# Patient Record
Sex: Female | Born: 1950 | Race: White | Hispanic: No | Marital: Married
Health system: Midwestern US, Academic
[De-identification: ages and names within clinical notes are randomized; demographics above are authoritative.]

---

## 2017-05-27 ENCOUNTER — Ambulatory Visit: Admit: 2017-05-27 | Discharge: 2017-05-28 | Payer: MEDICARE

## 2017-05-27 ENCOUNTER — Encounter: Admit: 2017-05-27 | Discharge: 2017-05-27 | Payer: MEDICARE

## 2017-05-27 DIAGNOSIS — I82412 Acute embolism and thrombosis of left femoral vein: Principal | ICD-10-CM

## 2017-05-28 DIAGNOSIS — Z95810 Presence of automatic (implantable) cardiac defibrillator: ICD-10-CM

## 2017-05-28 DIAGNOSIS — I5022 Chronic systolic (congestive) heart failure: Principal | ICD-10-CM

## 2017-05-29 ENCOUNTER — Ambulatory Visit: Admit: 2017-05-29 | Discharge: 2017-05-30 | Payer: MEDICARE

## 2017-05-29 ENCOUNTER — Encounter: Admit: 2017-05-29 | Discharge: 2017-05-29 | Payer: MEDICARE

## 2017-05-29 DIAGNOSIS — Z01810 Encounter for preprocedural cardiovascular examination: Principal | ICD-10-CM

## 2017-05-29 DIAGNOSIS — R9439 Abnormal result of other cardiovascular function study: ICD-10-CM

## 2017-05-29 DIAGNOSIS — M069 Rheumatoid arthritis, unspecified: ICD-10-CM

## 2017-05-29 DIAGNOSIS — Z8542 Personal history of malignant neoplasm of other parts of uterus: ICD-10-CM

## 2017-05-29 DIAGNOSIS — I428 Other cardiomyopathies: ICD-10-CM

## 2017-05-29 DIAGNOSIS — K219 Gastro-esophageal reflux disease without esophagitis: ICD-10-CM

## 2017-05-29 DIAGNOSIS — I420 Dilated cardiomyopathy: ICD-10-CM

## 2017-05-29 DIAGNOSIS — I5022 Chronic systolic (congestive) heart failure: Principal | ICD-10-CM

## 2017-05-29 DIAGNOSIS — Z95810 Presence of automatic (implantable) cardiac defibrillator: ICD-10-CM

## 2017-05-29 NOTE — Progress Notes
Date of Service: 05/29/2017    Pamela Hood is a 66 y.o. female.       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    In January 2016 her EF was 20% by echo, August 2016 at 15%, May 15,2015 45%, and recently on May 02, 2017 65%.    She followed up with Dr. Avie Arenas in our office March 06, 2017 and at that time she was not having any shortness of breath.  Previously we had given her Spironolactone to help with her EF but her blood pressure was low and it had to be discontinued.  The most recent interrogation of her ICD on February 27, 2017 revealed 41 episodes of probably atrial tachycardia that were all between 2 and 6 seconds in length, the total A. fib burden was less than 1%.    [name] presents to the office today telling me that since the last office visit she thinks she has been getting along very well.  She does have a history of falls and has had one fall since her last office visit.  She tells me last Saturday she was walking on the sidewalk and she tripped.  She did not get injured.  She does note diaphragmatic stimulation from her ICD that goes into her left lower abdomen.  She notes that that device reps had tried to decrease her settings to avoid this stimulation but we have not been able to remove it completely.  She tells me it is not a pain it is just annoying and it feels like a vibration into her abdomen.  She tells me she is tolerating all of her medications without any problems.    Assessment and plan    1.  Heart failure with previous ejection fraction as low as 15-20% and improved to 65% as of May 02, 2017.  She continues on losartan and metoprolol for guideline directed medical therapy.  She has previously been unable to tolerate Spironolactone due to low blood pressures.  She is a New York Heart Association class II symptoms.  We reviewed the need to call our office if she has increased weight, increased shortness of air, or increased lower leg edema.    2.  ICD implant for nonischemic cardiomyopathy and left bundle branch block???stable.  Her last device check from Apr 29, 2017 revealed less than 1% A. fib burden.  She denies any shocks from her ICD.  She does have some diaphragmatic stimulation that radiates into her abdomen but tells me it is just annoying and she is able to tolerate it.    3.  History of falls.  She denies feeling lightheaded or dizzy.  She reports that last Saturday she did trip on the sidewalk and fall but was not injured.  She denies any prodrome prior to falling.  She tells me she wonders if it is poor balance.    4. History of uterine cancer currently in the mid and remission.    5. History of DVT on coumadin anticoagulation.    She will plan to follow up in our office with Dr. Avie Arenas in 3 months.  Thank you for the opportunity to participate in the care of this patient.  Please feel free to call Pamela Hood if you have any questions or concerns.    Loraine Leriche, APRN-C  DRB         Vitals:    05/29/17 1259 05/29/17 1308   BP: 124/74 120/68   Pulse: 88    Weight: 60.1 kg (132 lb 9.6 oz)    Height: 1.549 m (5' 1)      Body mass index is 25.05 kg/m???.     Past Medical History  Patient Active Problem List    Diagnosis Date Noted   ??? Chronic systolic heart failure (HCC) 03/06/2017   ??? Vasovagal syncope 05/06/2016   ??? ICD (implantable cardioverter-defibrillator), biventricular, in situ 11/07/2015     ??? 11/07/15 CRT-D Implantation (no DFTs) - Dr. Wallene Huh - Device programmed around diaphragmatic stimulation      ??? NICM (nonischemic cardiomyopathy) (HCC) 11/07/2015   ??? Biventricular implantable cardioverter-defibrillator in situ 11/07/2015 ??? CHF (congestive heart failure), NYHA class III (HCC) 10/04/2015   ??? On warfarin therapy 08/24/2015   ??? Deep vein thrombosis (DVT) of femoral vein of left lower extremity (HCC) 07/06/2015   ??? ACE-inhibitor cough 12/13/2014   ??? Preoperative cardiovascular examination 07/29/2014   ??? Abnormal thallium stress test 07/29/2014     09/21/2009 Heart cath at [facility]: Normal Coronary arteries and LV function.  08/08/2009: MPI at [facility]:  Study demonstrates evidence of myocardial infarction involving the left anterior descending territory with significant reversible ischemia in this region.  Inferior wall myocardial infarction is noted.  Mild left ventricular systolic dysfunction with wall motion abnormalities as described above.       ??? GERD (gastroesophageal reflux disease) 07/29/2014   ??? Rheumatoid arthritis (HCC) 07/29/2014   ??? History of uterine cancer 07/29/2014   ??? Occipital neuralgia 07/29/2014   ??? Abnormal EKG 07/29/2014   ??? Dilated cardiomyopathy (HCC) 07/29/2014   ??? Left bundle branch block (LBBB) 07/29/2014         Review of Systems   Constitution: Negative.   HENT: Negative.    Eyes: Negative.    Cardiovascular: Negative.    Respiratory: Negative.    Endocrine: Negative.    Hematologic/Lymphatic: Negative.    Skin: Negative.    Musculoskeletal: Negative.    Gastrointestinal: Negative.    Genitourinary: Negative.    Neurological: Negative.    Psychiatric/Behavioral: Negative.    Allergic/Immunologic: Negative.    she denies having chest pain, palpitations, PND, orthopnea, lightheadedness, dizziness, pre-syncope, syncope, coughing, wheezing, shortness of air, dyspnea, sputum production, hemoptysis, or edema. She uses no tobacco products.      Physical Exam      General Appearance: resting comfortably, no acute distress  Skin: warm, moist, no ulcers or xanthomas, RA joint changes in hands  Eyes: conjunctivae and lids normal, pupils are equal and round  Lips & Oral Mucosa: no pallor or cyanosis Ear, Nose, Throat: No deformities  Neck Veins: neck veins are flat, neck veins are not distended  Thyroid: no nodules, masses, tenderness or enlargement  Chest Inspection: chest is normal in appearance, left subclavicular device scar  Respiratory Effort: breathing is unlabored, no respiratory distress  Auscultation/Percussion: lungs clear to auscultation, no rales, rhonchi, or wheezing  PMI: PMI not enlarged or displaced  Cardiac Rhythm: regular rhythm and normal rate  Cardiac Auscultation: Normal S1 & S2, no S3 or S4, no rub  Murmurs: no cardiac murmurs   Carotid Arteries: normal carotid upstroke bilaterally, no bruits  Pedal Pulses: normal symmetric pedal pulses  Lower Extremity Edema: no  lower extremity edema  Abdominal Exam: soft, non-tender, no masses, bowel sounds normal  Abdominal Aorta: nonpalpable abdominal aorta; no abdominal bruits  Liver & Spleen: no organomegaly  Gait & Station: normal balance and gait  Muscle Strength: normal strength and tone  Neurologic Exam: neurological assessment grossly intact  Orientation: oriented to time, place and person  Affect & Mood: appropriate and sustained affect  Other: moves all extremities            Problems Addressed Today  No diagnosis found.                  Current Medications (including today's revisions)  ??? ascorbic acid (VITAMIN-C) 500 mg tablet Take 1,000 mg by mouth daily.   ??? azaTHIOprine (IMURAN) 50 mg tablet Take 50 mg by mouth daily. 2 1/2 tab daily   ??? CALCIUM CARBONATE/VITAMIN D3 (CALCIUM WITH VITAMIN D PO) Take 1,400 mg by mouth twice daily.   ??? FERROUS SULFATE PO Take 130 mg by mouth.   ??? FLAXSEED OIL (OMEGA 3 PO) Take 900 mg by mouth twice daily.   ??? FLAXSEED OIL PO Take 1,000 mg by mouth daily.   ??? hydroxychloroquine (PLAQUENIL) 200 mg tablet Take 200 mg by mouth daily. takes 1 1/2 daily   ??? losartan (COZAAR) 25 mg tablet TAKE 1 TABLET EVERY DAY   ??? metoprolol XL (TOPROL XL) 25 mg extended release tablet TAKE 1 TABLET EVERY DAY ??? omeprazole DR(+) (PRILOSEC) 40 mg capsule Take 40 mg by mouth daily.   ??? warfarin (COUMADIN) 2 mg tablet Take 3 mg by mouth daily.

## 2017-05-30 DIAGNOSIS — Z95810 Presence of automatic (implantable) cardiac defibrillator: Principal | ICD-10-CM

## 2017-06-13 ENCOUNTER — Encounter: Admit: 2017-06-13 | Discharge: 2017-06-13 | Payer: MEDICARE

## 2017-06-13 NOTE — Telephone Encounter
Spoke with pt and she was given the results as below. Order placed for OV in 6 months

## 2017-06-13 NOTE — Telephone Encounter
-----   Message from Nickolas Madrid, MD sent at 06/12/2017  5:20 PM CDT -----  Please call this patient and informed her that the left ventricular systolic function is normal by the most recent echocardiogram.  I would like to see her back in the office in 6 months from the last office visit.    Thank you!      ----- Message -----  From: Laurence Aly, MD  Sent: 05/15/2017   9:14 AM  To: Nickolas Madrid, MD

## 2017-06-17 ENCOUNTER — Encounter: Admit: 2017-06-17 | Discharge: 2017-06-17 | Payer: MEDICARE

## 2017-06-18 MED ORDER — LOSARTAN 25 MG PO TAB
ORAL_TABLET | Freq: Every day | ORAL | 3 refills | 30.00000 days | Status: AC
Start: 2017-06-18 — End: 2018-05-11

## 2017-06-26 ENCOUNTER — Encounter: Admit: 2017-06-26 | Discharge: 2017-06-26 | Payer: MEDICARE

## 2017-06-26 NOTE — Telephone Encounter
Left msg at preferred # re: overdue INR. Asked pt to contact us if pt has had INR drawn recently. Advised if pt has not had INR since last check (05/27/17) pt should to get to lab within 1 week for INR. Left cardiology main # for call back prn.      ----- Message -----  From: Asencion Noble  Sent: 06/24/2017  To: [facility] Nurse [place]  Subject: INR Due 06/17/17                                  Darrel Hoover A is due for an INR test on 06/17/17.

## 2017-06-27 ENCOUNTER — Encounter: Admit: 2017-06-27 | Discharge: 2017-06-27 | Payer: MEDICARE

## 2017-06-27 DIAGNOSIS — I82412 Acute embolism and thrombosis of left femoral vein: Principal | ICD-10-CM

## 2017-06-27 MED ORDER — WARFARIN 2 MG PO TAB
2-4 mg | ORAL_TABLET | Freq: Every day | ORAL | 3 refills | 90.00000 days | Status: AC
Start: 2017-06-27 — End: 2018-08-07

## 2017-07-25 ENCOUNTER — Encounter: Admit: 2017-07-25 | Discharge: 2017-07-25 | Payer: MEDICARE

## 2017-07-25 NOTE — Progress Notes
Spoke with pt relative to results, dosage and date of next INR. Decreased dose on 07/25/17 by 50% and the weekly dose by 5 %. Pt will check INR on 07/31/17.

## 2017-08-05 ENCOUNTER — Encounter: Admit: 2017-08-05 | Discharge: 2017-08-05 | Payer: MEDICARE

## 2017-08-05 DIAGNOSIS — I82412 Acute embolism and thrombosis of left femoral vein: Principal | ICD-10-CM

## 2017-08-15 ENCOUNTER — Encounter: Admit: 2017-08-15 | Discharge: 2017-08-15 | Payer: MEDICARE

## 2017-08-15 NOTE — Progress Notes
CRITICAL RESULT NOTIFICATION    Critical result or procedure called (document test and value, and read back): completed with [facility] staff at 1251  Time MD/NP Notified: 1254  MD/NP Name: Pamela Raveling A. Fredda Hammed, MD (as Dr. Nickolas Madrid out of the office today)  MD/NP Response/Orders Given: Hold today's dose, then take 3mg  on Saturday and Sunday and recheck INR on Monday.    Attempted to call pt at preferred # on chart, but spouse states she has not returned from the lab and would be back in the afternoon. He states she does not routinely turn on her cell phone and suggested I call back in afternoon at home #.    Spoke to pt at 1346 and she indicated she had already taken today's dose. She will hold tomorrow's dose and take only 2mg  on Sunday with INR rehceck on Monday.Pamela A. Friday, MD agreed with plan. Pt also verbalized understanding and was agreeable to this plan. No further needs identified at this time.

## 2017-08-18 ENCOUNTER — Encounter: Admit: 2017-08-18 | Discharge: 2017-08-18 | Payer: MEDICARE

## 2017-08-18 DIAGNOSIS — I82412 Acute embolism and thrombosis of left femoral vein: Principal | ICD-10-CM

## 2017-08-26 ENCOUNTER — Encounter: Admit: 2017-08-26 | Discharge: 2017-08-26 | Payer: MEDICARE

## 2017-08-26 DIAGNOSIS — I82412 Acute embolism and thrombosis of left femoral vein: Principal | ICD-10-CM

## 2017-08-28 ENCOUNTER — Ambulatory Visit: Admit: 2017-08-28 | Discharge: 2017-08-29 | Payer: MEDICARE

## 2017-08-29 DIAGNOSIS — Z95810 Presence of automatic (implantable) cardiac defibrillator: ICD-10-CM

## 2017-08-29 DIAGNOSIS — I428 Other cardiomyopathies: Principal | ICD-10-CM

## 2017-09-03 ENCOUNTER — Encounter: Admit: 2017-09-03 | Discharge: 2017-09-03 | Payer: MEDICARE

## 2017-09-11 ENCOUNTER — Ambulatory Visit: Admit: 2017-09-11 | Discharge: 2017-09-12 | Payer: MEDICARE

## 2017-09-11 ENCOUNTER — Encounter: Admit: 2017-09-11 | Discharge: 2017-09-11 | Payer: MEDICARE

## 2017-09-11 DIAGNOSIS — I420 Dilated cardiomyopathy: Principal | ICD-10-CM

## 2017-09-11 DIAGNOSIS — K219 Gastro-esophageal reflux disease without esophagitis: ICD-10-CM

## 2017-09-11 DIAGNOSIS — M069 Rheumatoid arthritis, unspecified: ICD-10-CM

## 2017-09-11 DIAGNOSIS — I82412 Acute embolism and thrombosis of left femoral vein: ICD-10-CM

## 2017-09-11 DIAGNOSIS — Z7901 Long term (current) use of anticoagulants: ICD-10-CM

## 2017-09-11 DIAGNOSIS — I428 Other cardiomyopathies: ICD-10-CM

## 2017-09-11 DIAGNOSIS — Z01810 Encounter for preprocedural cardiovascular examination: Principal | ICD-10-CM

## 2017-09-11 DIAGNOSIS — Z95810 Presence of automatic (implantable) cardiac defibrillator: ICD-10-CM

## 2017-09-11 DIAGNOSIS — R05 Cough: ICD-10-CM

## 2017-09-11 DIAGNOSIS — I5022 Chronic systolic (congestive) heart failure: Principal | ICD-10-CM

## 2017-09-11 DIAGNOSIS — R55 Syncope and collapse: ICD-10-CM

## 2017-09-11 DIAGNOSIS — I447 Left bundle-branch block, unspecified: ICD-10-CM

## 2017-09-11 DIAGNOSIS — Z8542 Personal history of malignant neoplasm of other parts of uterus: ICD-10-CM

## 2017-09-18 ENCOUNTER — Encounter: Admit: 2017-09-18 | Discharge: 2017-09-18 | Payer: MEDICARE

## 2017-09-18 DIAGNOSIS — I82412 Acute embolism and thrombosis of left femoral vein: Principal | ICD-10-CM

## 2017-10-03 ENCOUNTER — Encounter: Admit: 2017-10-03 | Discharge: 2017-10-03 | Payer: MEDICARE

## 2017-10-09 ENCOUNTER — Encounter: Admit: 2017-10-09 | Discharge: 2017-10-09 | Payer: MEDICARE

## 2017-10-09 DIAGNOSIS — I82412 Acute embolism and thrombosis of left femoral vein: Principal | ICD-10-CM

## 2017-10-16 ENCOUNTER — Encounter: Admit: 2017-10-16 | Discharge: 2017-10-16 | Payer: MEDICARE

## 2017-10-16 DIAGNOSIS — I82412 Acute embolism and thrombosis of left femoral vein: Principal | ICD-10-CM

## 2017-10-17 ENCOUNTER — Encounter: Admit: 2017-10-17 | Discharge: 2017-10-17 | Payer: MEDICARE

## 2017-10-17 DIAGNOSIS — I82412 Acute embolism and thrombosis of left femoral vein: Principal | ICD-10-CM

## 2017-10-30 ENCOUNTER — Encounter: Admit: 2017-10-30 | Discharge: 2017-10-30 | Payer: MEDICARE

## 2017-10-30 DIAGNOSIS — I82412 Acute embolism and thrombosis of left femoral vein: Principal | ICD-10-CM

## 2017-10-31 ENCOUNTER — Encounter: Admit: 2017-10-31 | Discharge: 2017-10-31 | Payer: MEDICARE

## 2017-10-31 DIAGNOSIS — I82412 Acute embolism and thrombosis of left femoral vein: Principal | ICD-10-CM

## 2017-11-07 ENCOUNTER — Encounter: Admit: 2017-11-07 | Discharge: 2017-11-07 | Payer: MEDICARE

## 2017-11-07 DIAGNOSIS — I82412 Acute embolism and thrombosis of left femoral vein: Principal | ICD-10-CM

## 2017-11-17 ENCOUNTER — Encounter: Admit: 2017-11-17 | Discharge: 2017-11-17 | Payer: MEDICARE

## 2017-11-17 DIAGNOSIS — I82412 Acute embolism and thrombosis of left femoral vein: Principal | ICD-10-CM

## 2017-11-17 LAB — COMPREHENSIVE METABOLIC PANEL
Lab: 0.3
Lab: 1 — ABNORMAL LOW (ref 33.0–37.0)
Lab: 101
Lab: 103 10*3/uL — ABNORMAL LOW (ref 37.0–47.0)
Lab: 140 mL/min — ABNORMAL LOW (ref 4.20–5.40)
Lab: 17
Lab: 17 — ABNORMAL HIGH (ref 0–14)
Lab: 18
Lab: 24 10*3/uL (ref 0–0.20)
Lab: 3 — ABNORMAL LOW (ref 3.4–4.8)
Lab: 4 mL/min — ABNORMAL LOW (ref 12.0–16.0)
Lab: 66
Lab: 7.8 — ABNORMAL HIGH (ref 11.5–14.5)
Lab: 9
Lab: 9.4

## 2017-11-17 LAB — CBC: Lab: 4.1 10*3/uL — ABNORMAL LOW (ref 4.8–10.8)

## 2017-11-27 ENCOUNTER — Ambulatory Visit: Admit: 2017-11-27 | Discharge: 2017-11-28 | Payer: MEDICARE

## 2017-11-28 ENCOUNTER — Encounter: Admit: 2017-11-28 | Discharge: 2017-11-28 | Payer: MEDICARE

## 2017-11-28 DIAGNOSIS — I428 Other cardiomyopathies: Principal | ICD-10-CM

## 2017-11-28 DIAGNOSIS — Z95810 Presence of automatic (implantable) cardiac defibrillator: ICD-10-CM

## 2017-12-08 ENCOUNTER — Encounter: Admit: 2017-12-08 | Discharge: 2017-12-08 | Payer: MEDICARE

## 2018-01-06 ENCOUNTER — Encounter: Admit: 2018-01-06 | Discharge: 2018-01-06 | Payer: MEDICARE

## 2018-01-06 DIAGNOSIS — I82412 Acute embolism and thrombosis of left femoral vein: Principal | ICD-10-CM

## 2018-01-29 ENCOUNTER — Encounter: Admit: 2018-01-29 | Discharge: 2018-01-29 | Payer: MEDICARE

## 2018-01-30 MED ORDER — METOPROLOL SUCCINATE 25 MG PO TB24
ORAL_TABLET | Freq: Every day | ORAL | 3 refills | 90.00000 days | Status: AC
Start: 2018-01-30 — End: 2019-02-23

## 2018-02-04 ENCOUNTER — Encounter: Admit: 2018-02-04 | Discharge: 2018-02-04 | Payer: MEDICARE

## 2018-02-04 DIAGNOSIS — I82412 Acute embolism and thrombosis of left femoral vein: Principal | ICD-10-CM

## 2018-02-04 LAB — PROTIME INR (PT): Lab: 2.4

## 2018-02-06 ENCOUNTER — Encounter: Admit: 2018-02-06 | Discharge: 2018-02-06 | Payer: MEDICARE

## 2018-02-06 DIAGNOSIS — I82412 Acute embolism and thrombosis of left femoral vein: Principal | ICD-10-CM

## 2018-02-06 LAB — PROTIME INR (PT): Lab: 2.9 MMOL/L (ref 21–30)

## 2018-02-26 ENCOUNTER — Ambulatory Visit: Admit: 2018-02-26 | Discharge: 2018-02-27 | Payer: MEDICARE

## 2018-02-27 DIAGNOSIS — Z95810 Presence of automatic (implantable) cardiac defibrillator: ICD-10-CM

## 2018-02-27 DIAGNOSIS — I428 Other cardiomyopathies: Principal | ICD-10-CM

## 2018-03-16 ENCOUNTER — Encounter: Admit: 2018-03-16 | Discharge: 2018-03-16 | Payer: MEDICARE

## 2018-03-17 ENCOUNTER — Encounter: Admit: 2018-03-17 | Discharge: 2018-03-17 | Payer: MEDICARE

## 2018-03-26 ENCOUNTER — Encounter: Admit: 2018-03-26 | Discharge: 2018-03-26 | Payer: MEDICARE

## 2018-03-26 DIAGNOSIS — M069 Rheumatoid arthritis, unspecified: ICD-10-CM

## 2018-03-26 DIAGNOSIS — K219 Gastro-esophageal reflux disease without esophagitis: ICD-10-CM

## 2018-03-26 DIAGNOSIS — Z01810 Encounter for preprocedural cardiovascular examination: Principal | ICD-10-CM

## 2018-03-26 DIAGNOSIS — Z8542 Personal history of malignant neoplasm of other parts of uterus: ICD-10-CM

## 2018-04-06 ENCOUNTER — Encounter: Admit: 2018-04-06 | Discharge: 2018-04-06 | Payer: MEDICARE

## 2018-04-06 LAB — COMPREHENSIVE METABOLIC PANEL
Lab: 0.3
Lab: 2.8 — ABNORMAL LOW (ref 3.4–4.8)
Lab: 7.2 — ABNORMAL HIGH (ref 11.5–14.5)
Lab: 9.2

## 2018-04-06 LAB — PROTIME INR (PT): Lab: 2.1

## 2018-04-22 ENCOUNTER — Encounter: Admit: 2018-04-22 | Discharge: 2018-04-22 | Payer: MEDICARE

## 2018-04-22 ENCOUNTER — Ambulatory Visit: Admit: 2018-04-22 | Discharge: 2018-04-23 | Payer: MEDICARE

## 2018-04-22 DIAGNOSIS — I428 Other cardiomyopathies: ICD-10-CM

## 2018-04-22 DIAGNOSIS — Z95810 Presence of automatic (implantable) cardiac defibrillator: ICD-10-CM

## 2018-04-22 DIAGNOSIS — I420 Dilated cardiomyopathy: Principal | ICD-10-CM

## 2018-05-04 ENCOUNTER — Encounter: Admit: 2018-05-04 | Discharge: 2018-05-04 | Payer: MEDICARE

## 2018-05-04 DIAGNOSIS — I82412 Acute embolism and thrombosis of left femoral vein: Principal | ICD-10-CM

## 2018-05-04 LAB — PROTIME INR (PT): Lab: 2.6

## 2018-05-11 ENCOUNTER — Encounter: Admit: 2018-05-11 | Discharge: 2018-05-11 | Payer: MEDICARE

## 2018-05-11 MED ORDER — LOSARTAN 25 MG PO TAB
ORAL_TABLET | Freq: Every day | ORAL | 3 refills | 30.00000 days | Status: AC
Start: 2018-05-11 — End: 2019-09-09

## 2018-05-28 ENCOUNTER — Ambulatory Visit: Admit: 2018-05-28 | Discharge: 2018-05-29 | Payer: MEDICARE

## 2018-05-29 DIAGNOSIS — Z95810 Presence of automatic (implantable) cardiac defibrillator: Principal | ICD-10-CM

## 2018-06-01 ENCOUNTER — Encounter: Admit: 2018-06-01 | Discharge: 2018-06-01 | Payer: MEDICARE

## 2018-06-01 DIAGNOSIS — I82412 Acute embolism and thrombosis of left femoral vein: Principal | ICD-10-CM

## 2018-06-01 LAB — PROTIME INR (PT): Lab: 3.2

## 2018-06-04 ENCOUNTER — Encounter: Admit: 2018-06-04 | Discharge: 2018-06-04 | Payer: MEDICARE

## 2018-06-04 ENCOUNTER — Ambulatory Visit: Admit: 2018-06-04 | Discharge: 2018-06-05 | Payer: MEDICARE

## 2018-06-04 DIAGNOSIS — M069 Rheumatoid arthritis, unspecified: ICD-10-CM

## 2018-06-04 DIAGNOSIS — I428 Other cardiomyopathies: Principal | ICD-10-CM

## 2018-06-04 DIAGNOSIS — I82412 Acute embolism and thrombosis of left femoral vein: ICD-10-CM

## 2018-06-04 DIAGNOSIS — Z95810 Presence of automatic (implantable) cardiac defibrillator: Secondary | ICD-10-CM

## 2018-06-04 DIAGNOSIS — Z8542 Personal history of malignant neoplasm of other parts of uterus: ICD-10-CM

## 2018-06-04 DIAGNOSIS — R9431 Abnormal electrocardiogram [ECG] [EKG]: ICD-10-CM

## 2018-06-04 DIAGNOSIS — K219 Gastro-esophageal reflux disease without esophagitis: ICD-10-CM

## 2018-06-04 DIAGNOSIS — Z7901 Long term (current) use of anticoagulants: ICD-10-CM

## 2018-06-04 DIAGNOSIS — I5022 Chronic systolic (congestive) heart failure: Secondary | ICD-10-CM

## 2018-06-04 DIAGNOSIS — Z01810 Encounter for preprocedural cardiovascular examination: Principal | ICD-10-CM

## 2018-06-04 DIAGNOSIS — I447 Left bundle-branch block, unspecified: ICD-10-CM

## 2018-06-04 DIAGNOSIS — R05 Cough: ICD-10-CM

## 2018-06-04 DIAGNOSIS — I420 Dilated cardiomyopathy: ICD-10-CM

## 2018-06-08 ENCOUNTER — Encounter: Admit: 2018-06-08 | Discharge: 2018-06-08 | Payer: MEDICARE

## 2018-06-08 DIAGNOSIS — I82412 Acute embolism and thrombosis of left femoral vein: Principal | ICD-10-CM

## 2018-06-08 LAB — PROTIME INR (PT): Lab: 3

## 2018-06-19 ENCOUNTER — Encounter: Admit: 2018-06-19 | Discharge: 2018-06-19 | Payer: MEDICARE

## 2018-06-19 DIAGNOSIS — I82412 Acute embolism and thrombosis of left femoral vein: Principal | ICD-10-CM

## 2018-06-19 LAB — PROTIME INR (PT): Lab: 2.7 K/UL — ABNORMAL LOW (ref 1.0–4.8)

## 2018-07-24 ENCOUNTER — Encounter: Admit: 2018-07-24 | Discharge: 2018-07-24 | Payer: MEDICARE

## 2018-07-24 ENCOUNTER — Ambulatory Visit: Admit: 2018-07-24 | Discharge: 2018-07-25 | Payer: MEDICARE

## 2018-07-24 DIAGNOSIS — I428 Other cardiomyopathies: Principal | ICD-10-CM

## 2018-07-24 DIAGNOSIS — Z95810 Presence of automatic (implantable) cardiac defibrillator: ICD-10-CM

## 2018-08-05 ENCOUNTER — Encounter: Admit: 2018-08-05 | Discharge: 2018-08-05 | Payer: MEDICARE

## 2018-08-07 ENCOUNTER — Encounter: Admit: 2018-08-07 | Discharge: 2018-08-07 | Payer: MEDICARE

## 2018-08-07 ENCOUNTER — Ambulatory Visit: Admit: 2018-08-07 | Discharge: 2018-08-08 | Payer: MEDICARE

## 2018-08-07 DIAGNOSIS — I5022 Chronic systolic (congestive) heart failure: Secondary | ICD-10-CM

## 2018-08-07 DIAGNOSIS — E7800 Pure hypercholesterolemia, unspecified: Principal | ICD-10-CM

## 2018-08-07 DIAGNOSIS — I447 Left bundle-branch block, unspecified: ICD-10-CM

## 2018-08-07 DIAGNOSIS — K219 Gastro-esophageal reflux disease without esophagitis: ICD-10-CM

## 2018-08-07 DIAGNOSIS — Z95810 Presence of automatic (implantable) cardiac defibrillator: ICD-10-CM

## 2018-08-07 DIAGNOSIS — M069 Rheumatoid arthritis, unspecified: ICD-10-CM

## 2018-08-07 DIAGNOSIS — I420 Dilated cardiomyopathy: ICD-10-CM

## 2018-08-07 DIAGNOSIS — Z01810 Encounter for preprocedural cardiovascular examination: Principal | ICD-10-CM

## 2018-08-07 DIAGNOSIS — I428 Other cardiomyopathies: ICD-10-CM

## 2018-08-07 DIAGNOSIS — R55 Syncope and collapse: ICD-10-CM

## 2018-08-07 DIAGNOSIS — Z8542 Personal history of malignant neoplasm of other parts of uterus: ICD-10-CM

## 2018-08-07 DIAGNOSIS — R9431 Abnormal electrocardiogram [ECG] [EKG]: ICD-10-CM

## 2018-08-07 DIAGNOSIS — I82412 Acute embolism and thrombosis of left femoral vein: ICD-10-CM

## 2018-08-07 DIAGNOSIS — R05 Cough: ICD-10-CM

## 2018-08-14 ENCOUNTER — Encounter: Admit: 2018-08-14 | Discharge: 2018-08-14 | Payer: MEDICARE

## 2018-08-14 DIAGNOSIS — I428 Other cardiomyopathies: Principal | ICD-10-CM

## 2018-08-27 ENCOUNTER — Ambulatory Visit: Admit: 2018-08-27 | Discharge: 2018-08-28 | Payer: MEDICARE

## 2018-08-28 DIAGNOSIS — I428 Other cardiomyopathies: Principal | ICD-10-CM

## 2018-08-28 DIAGNOSIS — Z95810 Presence of automatic (implantable) cardiac defibrillator: ICD-10-CM

## 2018-10-09 ENCOUNTER — Encounter: Admit: 2018-10-09 | Discharge: 2018-10-09 | Payer: MEDICARE

## 2018-11-26 DIAGNOSIS — I420 Dilated cardiomyopathy: Secondary | ICD-10-CM

## 2018-11-27 ENCOUNTER — Ambulatory Visit: Admit: 2018-11-26 | Discharge: 2018-11-27 | Payer: MEDICARE

## 2018-11-27 DIAGNOSIS — I5022 Chronic systolic (congestive) heart failure: Principal | ICD-10-CM

## 2018-11-27 DIAGNOSIS — Z95810 Presence of automatic (implantable) cardiac defibrillator: ICD-10-CM

## 2018-12-25 LAB — CBC
Lab: 10 — ABNORMAL LOW (ref 12.0–16.0)
Lab: 3.5 — ABNORMAL LOW (ref 4.20–5.40)
Lab: 34 — ABNORMAL LOW (ref 37.0–47.0)
Lab: 4.4 — ABNORMAL LOW (ref 4.8–10.8)
Lab: 95

## 2018-12-25 LAB — COMPREHENSIVE METABOLIC PANEL
Lab: 102 — ABNORMAL LOW (ref 33.0–37.0)
Lab: 4.7

## 2019-01-19 ENCOUNTER — Encounter: Admit: 2019-01-19 | Discharge: 2019-01-19 | Payer: MEDICARE

## 2019-02-18 ENCOUNTER — Encounter: Admit: 2019-02-18 | Discharge: 2019-02-18 | Payer: MEDICARE

## 2019-02-18 LAB — HEMOGLOBIN A1C: Lab: 5.4 — ABNORMAL LOW

## 2019-02-18 LAB — BASIC METABOLIC PANEL
Lab: 103 — ABNORMAL LOW
Lab: 139 — ABNORMAL LOW
Lab: 18
Lab: 29
Lab: 7
Lab: >60
Lab: 0.8
Lab: 93
Lab: >60

## 2019-02-18 LAB — CBC: Lab: 4.1

## 2019-02-23 ENCOUNTER — Ambulatory Visit: Admit: 2019-02-23 | Discharge: 2019-02-23 | Payer: MEDICARE

## 2019-02-23 ENCOUNTER — Encounter: Admit: 2019-02-23 | Discharge: 2019-02-23 | Payer: MEDICARE

## 2019-02-23 DIAGNOSIS — I5022 Chronic systolic (congestive) heart failure: ICD-10-CM

## 2019-02-23 DIAGNOSIS — I420 Dilated cardiomyopathy: ICD-10-CM

## 2019-02-23 DIAGNOSIS — I82412 Acute embolism and thrombosis of left femoral vein: ICD-10-CM

## 2019-02-23 DIAGNOSIS — Z01810 Encounter for preprocedural cardiovascular examination: Principal | ICD-10-CM

## 2019-02-23 DIAGNOSIS — M069 Rheumatoid arthritis, unspecified: ICD-10-CM

## 2019-02-23 DIAGNOSIS — K219 Gastro-esophageal reflux disease without esophagitis: ICD-10-CM

## 2019-02-23 DIAGNOSIS — R9431 Abnormal electrocardiogram [ECG] [EKG]: ICD-10-CM

## 2019-02-23 DIAGNOSIS — I447 Left bundle-branch block, unspecified: Principal | ICD-10-CM

## 2019-02-23 DIAGNOSIS — I428 Other cardiomyopathies: ICD-10-CM

## 2019-02-23 DIAGNOSIS — Z8542 Personal history of malignant neoplasm of other parts of uterus: ICD-10-CM

## 2019-02-23 DIAGNOSIS — R05 Cough: ICD-10-CM

## 2019-02-23 DIAGNOSIS — Z95810 Presence of automatic (implantable) cardiac defibrillator: ICD-10-CM

## 2019-02-23 MED ORDER — METOPROLOL SUCCINATE 25 MG PO TB24
25 mg | ORAL_TABLET | Freq: Every day | ORAL | 3 refills | 90.00000 days | Status: AC
Start: 2019-02-23 — End: 2019-12-15

## 2019-02-25 ENCOUNTER — Ambulatory Visit: Admit: 2019-02-25 | Discharge: 2019-02-25 | Payer: MEDICARE

## 2019-02-25 DIAGNOSIS — Z95810 Presence of automatic (implantable) cardiac defibrillator: ICD-10-CM

## 2019-02-25 DIAGNOSIS — I5022 Chronic systolic (congestive) heart failure: Principal | ICD-10-CM

## 2019-02-26 ENCOUNTER — Encounter: Admit: 2019-02-26 | Discharge: 2019-02-26 | Payer: MEDICARE

## 2019-02-26 ENCOUNTER — Ambulatory Visit: Admit: 2019-02-26 | Discharge: 2019-02-27 | Payer: MEDICARE

## 2019-02-26 DIAGNOSIS — I5022 Chronic systolic (congestive) heart failure: ICD-10-CM

## 2019-02-26 DIAGNOSIS — I447 Left bundle-branch block, unspecified: Principal | ICD-10-CM

## 2019-02-26 DIAGNOSIS — I420 Dilated cardiomyopathy: ICD-10-CM

## 2019-03-01 ENCOUNTER — Encounter: Admit: 2019-03-01 | Discharge: 2019-03-01 | Payer: MEDICARE

## 2019-03-12 ENCOUNTER — Encounter: Admit: 2019-03-12 | Discharge: 2019-03-12 | Payer: MEDICARE

## 2019-03-12 NOTE — Telephone Encounter
I called and let patient know that I cancelled her ppm check at the [place] office on.3.24.20. She is on the list to be called later to reschedule.

## 2019-03-12 NOTE — Telephone Encounter
-----   Message from Casey Burkitt, RN sent at 03/12/2019 10:48 AM CDT -----  Regarding: Pls call patient and cancel her in office check scheduled in [place] 3/24  This is a routine check and it can be postponed.  She is monitored remotely.  I have put her on a list to reschedule at a later date.

## 2019-05-27 ENCOUNTER — Encounter: Admit: 2019-05-27 | Discharge: 2019-05-27

## 2019-05-27 DIAGNOSIS — Z95810 Presence of automatic (implantable) cardiac defibrillator: Secondary | ICD-10-CM

## 2019-05-28 ENCOUNTER — Ambulatory Visit: Admit: 2019-05-27 | Discharge: 2019-05-28

## 2019-05-28 DIAGNOSIS — I428 Other cardiomyopathies: Secondary | ICD-10-CM

## 2019-05-28 DIAGNOSIS — I5022 Chronic systolic (congestive) heart failure: Secondary | ICD-10-CM

## 2019-08-17 ENCOUNTER — Ambulatory Visit: Admit: 2019-08-17 | Discharge: 2019-08-18

## 2019-08-17 ENCOUNTER — Encounter: Admit: 2019-08-17 | Discharge: 2019-08-17

## 2019-08-17 DIAGNOSIS — Z95810 Presence of automatic (implantable) cardiac defibrillator: Secondary | ICD-10-CM

## 2019-08-17 DIAGNOSIS — I428 Other cardiomyopathies: Secondary | ICD-10-CM

## 2019-08-18 ENCOUNTER — Encounter: Admit: 2019-08-18 | Discharge: 2019-08-18

## 2019-08-18 DIAGNOSIS — I420 Dilated cardiomyopathy: Secondary | ICD-10-CM

## 2019-08-18 DIAGNOSIS — I447 Left bundle-branch block, unspecified: Secondary | ICD-10-CM

## 2019-08-18 DIAGNOSIS — I5022 Chronic systolic (congestive) heart failure: Secondary | ICD-10-CM

## 2019-08-18 NOTE — Telephone Encounter
-----   Message from Olena Mater, [place] sent at 08/17/2019  4:32 PM CDT -----  Regarding: in-office check today shows ongoing fl. overload/HF  corVue showing possible fl overload/HF ongoing since 08/09/19.    Please f/u as needed.    Thanks,  Kaylee/device team

## 2019-08-18 NOTE — Telephone Encounter
Called and left message requesting callback to discuss symptoms.

## 2019-08-18 NOTE — Telephone Encounter
Patient returned call.  No s/s of heart failure.

## 2019-08-19 ENCOUNTER — Encounter: Admit: 2019-08-19 | Discharge: 2019-08-19

## 2019-08-19 NOTE — Telephone Encounter
08/19/2019 10:44 AM    I received a message from Pamela Hood. She wanted [name] to know that she frequently sits down to rest and falls asleep in the chair.  She wanted to know if this had anything to do with the ICD report showing she has extra fluid on board.  I called her back and she told me she just falls asleep a lot during the day.  I offered her an appointment with Dr Virgina Organ at the [place] office next week 08/24/19 at 11:30

## 2019-08-24 ENCOUNTER — Encounter: Admit: 2019-08-24 | Discharge: 2019-08-24

## 2019-08-24 ENCOUNTER — Ambulatory Visit: Admit: 2019-08-24 | Discharge: 2019-08-25

## 2019-08-24 DIAGNOSIS — I447 Left bundle-branch block, unspecified: Secondary | ICD-10-CM

## 2019-08-24 DIAGNOSIS — Z01810 Encounter for preprocedural cardiovascular examination: Secondary | ICD-10-CM

## 2019-08-24 DIAGNOSIS — I420 Dilated cardiomyopathy: Secondary | ICD-10-CM

## 2019-08-24 DIAGNOSIS — Z8542 Personal history of malignant neoplasm of other parts of uterus: Secondary | ICD-10-CM

## 2019-08-24 DIAGNOSIS — Z95810 Presence of automatic (implantable) cardiac defibrillator: Secondary | ICD-10-CM

## 2019-08-24 DIAGNOSIS — K219 Gastro-esophageal reflux disease without esophagitis: Secondary | ICD-10-CM

## 2019-08-24 DIAGNOSIS — M069 Rheumatoid arthritis, unspecified: Secondary | ICD-10-CM

## 2019-08-24 DIAGNOSIS — I428 Other cardiomyopathies: Secondary | ICD-10-CM

## 2019-08-24 DIAGNOSIS — I82412 Acute embolism and thrombosis of left femoral vein: Secondary | ICD-10-CM

## 2019-08-24 DIAGNOSIS — R05 Cough: Secondary | ICD-10-CM

## 2019-08-24 DIAGNOSIS — I5022 Chronic systolic (congestive) heart failure: Secondary | ICD-10-CM

## 2019-08-24 NOTE — Progress Notes
Date of Service: 08/24/2019    Pamela Hood is a 68 y.o. female.       HPI     Is a very pleasant 68 year old white female, she does have a history of dilated nonischemic cardiomyopathy, in the past her ejection fraction 20%, this was upon diagnosis in August 2015.  She is also known to have a left bundle branch block at baseline.    Patient did undergo goal-directed heart failure therapy and also she underwent resynchronization therapy, the left ventricular systolic function has normalized.  The most recent echocardiogram performed in March 2020 demonstrated an ejection fraction of 55%.    She is not known to have coronary artery disease, patient did undergo evaluation with a thallium stress test, the last one being performed on 08/01/2014, the tomographic pattern was negative for ischemia.    This patient does have rheumatoid arthritis with profound deformities of her digits.  She is currently on 3 immune therapies that include azathioprine, hydroxychloroquine and romosozumab.    The last device interrogation performed on August 17, 2019 did demonstrate fluid overload, however this finding is not matched by physical exam findings.         Vitals:    08/24/19 1117 08/24/19 1131   BP: 126/64 128/60   BP Source: Arm, Left Upper Arm, Right Upper   Pulse: 80    Temp: 36.8 ???C (98.2 ???F)    SpO2: 98%    Weight: 51.3 kg (113 lb)    Height: 1.549 m (5' 1)    PainSc: Zero      Body mass index is 21.35 kg/m???.     Past Medical History  Patient Active Problem List    Diagnosis Date Noted   ??? Preoperative cardiovascular examination 02/23/2019   ??? Chronic systolic heart failure (HCC) 03/06/2017   ??? Vasovagal syncope 05/06/2016   ??? ICD (implantable cardioverter-defibrillator), biventricular, in situ 11/07/2015     ??? 11/07/15 CRT-D Implantation (no DFTs) - Dr. Wallene Huh - Device programmed around diaphragmatic stimulation      ??? NICM (nonischemic cardiomyopathy) (HCC) 11/07/2015 ??? Cardiac resynchronization therapy defibrillator (CRT-D) in place 11/07/2015   ??? CHF (congestive heart failure), NYHA class III (HCC) 10/04/2015   ??? On warfarin therapy 08/24/2015   ??? Deep vein thrombosis (DVT) of femoral vein of left lower extremity (HCC) 07/06/2015   ??? ACE-inhibitor cough 12/13/2014   ??? Preoperative cardiovascular examination 07/29/2014   ??? Abnormal thallium stress test 07/29/2014     09/21/2009 Heart cath at [facility]: Normal Coronary arteries and LV function.  08/08/2009: MPI at [facility]:  Study demonstrates evidence of myocardial infarction involving the left anterior descending territory with significant reversible ischemia in this region.  Inferior wall myocardial infarction is noted.  Mild left ventricular systolic dysfunction with wall motion abnormalities as described above.       ??? GERD (gastroesophageal reflux disease) 07/29/2014   ??? Rheumatoid arthritis (HCC) 07/29/2014   ??? History of uterine cancer 07/29/2014   ??? Occipital neuralgia 07/29/2014   ??? Abnormal EKG 07/29/2014   ??? Dilated cardiomyopathy (HCC) 07/29/2014   ??? Left bundle branch block (LBBB) 07/29/2014         Review of Systems   Constitution: Positive for malaise/fatigue and weight loss.   HENT: Negative.    Eyes: Negative.    Cardiovascular: Negative.    Respiratory: Negative.    Endocrine: Negative.    Hematologic/Lymphatic: Negative.    Skin: Negative.    Musculoskeletal: Positive for arthritis, back  pain, joint pain, myalgias and neck pain.   Gastrointestinal: Negative.    Genitourinary: Negative.    Neurological: Negative.    Psychiatric/Behavioral: Negative.    Allergic/Immunologic: Positive for environmental allergies.       Physical Exam  General Appearance: normal in appearance  Skin: warm, moist, no ulcers or xanthomas  Eyes: conjunctivae and lids normal, pupils are equal and round  Lips & Oral Mucosa: no pallor or cyanosis  Neck Veins: neck veins are flat, neck veins are not distended Chest Inspection: chest is normal in appearance  Respiratory Effort: breathing comfortably, no respiratory distress  Auscultation/Percussion: lungs clear to auscultation, no rales or rhonchi, no wheezing  Cardiac Rhythm: regular rhythm and normal rate  Cardiac Auscultation: S1, S2 normal, no rub, no gallop  Murmurs: no murmur  Carotid Arteries: normal carotid upstroke bilaterally, no bruit  Lower Extremity Edema: no lower extremity edema, peripheral digit deformities due to rheumatoid arthritis  Abdominal Exam: soft, non-tender, no masses, bowel sounds normal  Liver & Spleen: no organomegaly  Language and Memory: patient responsive and seems to comprehend information  Neurologic Exam: neurological assessment grossly intact      Cardiovascular Studies  Twelve-lead EKG demonstrates a sensed V paced rhythm    Problems Addressed Today  Encounter Diagnoses   Name Primary?   ??? Dilated cardiomyopathy (HCC) Yes   ??? Biventricular ICD (implantable cardioverter-defibrillator) in place    ??? Left bundle branch block (LBBB)    ??? Deep vein thrombosis (DVT) of femoral vein of left lower extremity, unspecified chronicity (HCC)    ??? Chronic systolic congestive heart failure, NYHA class 3 (HCC)    ??? NICM (nonischemic cardiomyopathy) (HCC)    ??? Chronic systolic heart failure (HCC)    ??? Rheumatoid arthritis involving multiple sites, unspecified rheumatoid factor presence (HCC)    ??? ACE-inhibitor cough        Assessment and Plan     In summary: This is a 68 year old white female, she is known to have a dilated nonischemic cardiomyopathy with severely preserved ventricular systolic function of 20% in August 2015 upon diagnosis, patient also has a baseline left bundle branch block, with goal-directed heart failure therapy and resynchronization therapy patient's systolic function has recovered, the most recent echocardiogram performed in March 2020 revealed an EF of 55%.    Patient does not have any known CAD. She does have rheumatoid arthritis and she is currently on 3 anti-immune therapy.    Plan:       1.  Continue all current medications  2.  I did ask the patient to follow a low-salt diet  3.  She will be reevaluated with a 2D echo Doppler study in approximately 6 to 7 months, that will be followed by an office visit with me.         Current Medications (including today's revisions)  ??? ascorbic acid (VITAMIN-C) 500 mg tablet Take 1,000 mg by mouth daily.   ??? azaTHIOprine (IMURAN) 50 mg tablet Take 50 mg by mouth daily. 2 1/2 tab daily   ??? CALCIUM CARBONATE/VITAMIN D3 (CALCIUM WITH VITAMIN D PO) Take 1,400 mg by mouth twice daily.   ??? esomeprazole DR (NEXIUM) 40 mg capsule Take 1 capsule by mouth daily.   ??? ferrous sulfate (FEOSOL, FEROSUL) 325 mg (65 mg iron) tablet Take 325 mg by mouth twice daily. Take on an empty stomach at least 1 hour before or 2 hours after food.   ??? FLAXSEED OIL PO Take 1,000 mg by  mouth daily.   ??? hydroxychloroquine (PLAQUENIL) 200 mg tablet Take 200 mg by mouth daily. takes 1 1/2 daily   ??? losartan (COZAAR) 25 mg tablet TAKE 1 TABLET EVERY DAY   ??? metoprolol XL (TOPROL XL) 25 mg extended release tablet Take one tablet by mouth daily.   ??? romosozumab-aqqg (EVENITY) 105 mg/1.17 mL syringe Inject 105 mg under the skin every 28 days.

## 2019-08-26 ENCOUNTER — Ambulatory Visit: Admit: 2019-08-27 | Discharge: 2019-08-27

## 2019-08-26 DIAGNOSIS — Z95810 Presence of automatic (implantable) cardiac defibrillator: Secondary | ICD-10-CM

## 2019-08-27 ENCOUNTER — Encounter: Admit: 2019-08-27 | Discharge: 2019-08-27

## 2019-08-27 DIAGNOSIS — Z95810 Presence of automatic (implantable) cardiac defibrillator: Secondary | ICD-10-CM

## 2019-08-27 DIAGNOSIS — I447 Left bundle-branch block, unspecified: Secondary | ICD-10-CM

## 2019-08-27 DIAGNOSIS — I5022 Chronic systolic (congestive) heart failure: Secondary | ICD-10-CM

## 2019-08-27 DIAGNOSIS — I420 Dilated cardiomyopathy: Secondary | ICD-10-CM

## 2019-09-09 ENCOUNTER — Encounter: Admit: 2019-09-09 | Discharge: 2019-09-09 | Payer: MEDICARE

## 2019-09-09 MED ORDER — LOSARTAN 25 MG PO TAB
25 mg | ORAL_TABLET | Freq: Every day | ORAL | 3 refills | 30.00000 days | Status: DC
Start: 2019-09-09 — End: 2019-09-13

## 2019-09-09 NOTE — Telephone Encounter
Received a fax from the patients local pharmacy requesting a refill. Rx sent as requested by the local pharmacy

## 2019-09-13 ENCOUNTER — Encounter: Admit: 2019-09-13 | Discharge: 2019-09-13 | Payer: MEDICARE

## 2019-09-13 MED ORDER — LOSARTAN 25 MG PO TAB
25 mg | ORAL_TABLET | Freq: Every day | ORAL | 3 refills | 30.00000 days | Status: DC
Start: 2019-09-13 — End: 2019-09-17

## 2019-09-17 ENCOUNTER — Encounter: Admit: 2019-09-17 | Discharge: 2019-09-17 | Payer: MEDICARE

## 2019-09-17 MED ORDER — LOSARTAN 25 MG PO TAB
25 mg | ORAL_TABLET | Freq: Every day | ORAL | 3 refills | 30.00000 days | Status: DC
Start: 2019-09-17 — End: 2019-12-15

## 2019-10-22 ENCOUNTER — Encounter: Admit: 2019-10-22 | Discharge: 2019-10-22 | Payer: MEDICARE

## 2019-10-22 NOTE — Telephone Encounter
Contacted patient by phone on 10/22/2019 regarding appointment scheduled with Dr. Samantha Crimes on 11/11/2019. Patient was originally scheduled for November appointment in August. One on 08/15 and the other on 08/27 for increased fatigue. Patient was able to get in to see Dr. Virgina Organ in [place] on 08/24/2019 for the increased fatigue. In [place] last note on 09/01, she wanted an Echocardiogram in 6-7 months and a follow up with her after the Echo. Patient denied complaints and any current cardiac symptoms at this time. Patient's appointment scheduled for 11/11/2019 is cancelled per coordination of patient care. Patient scheduled for 6 month follow up with Dr. Samantha Crimes in [facility] on 03/02/2020. Patient notified that she will need Echocardiogram before scheduled follow up appointment. Patient requested for Echocardiogram to be performed at [facility]. Patient notified that we will send the order to [facility] and they will contact patient to schedule test. Patient voiced understanding. Patient encouraged to contact our office with any symptoms or complaints before scheduled appointment on 03/02/2020. In basket message sent to [place] Nurse to place order for routine 2D+Doppler Echocardiogram per [facility] to be completed before scheduled appointment and fax to [facility] in [place], [place].

## 2019-11-30 ENCOUNTER — Encounter: Admit: 2019-11-30 | Discharge: 2019-11-30 | Payer: MEDICARE

## 2019-11-30 NOTE — Telephone Encounter
-----   Message from Lillie Fragmin, RN sent at 11/30/2019 12:53 PM CST -----  Regarding: NSVT on remote  Duration 15 sec. Fell below detection rate at end of event. Detection may need to be adjusted.

## 2019-11-30 NOTE — Telephone Encounter
-----   Message from Susan Morrissey, RN sent at 11/30/2019 12:53 PM CST -----  Regarding: NSVT on remote  Duration 15 sec. Fell below detection rate at end of event. Detection may need to be adjusted.

## 2019-11-30 NOTE — Telephone Encounter
Pt denies issues.  States palpations at least weekly.  Routed to [facility] for review and recommendations.

## 2019-12-07 ENCOUNTER — Encounter: Admit: 2019-12-07 | Discharge: 2019-12-07 | Payer: MEDICARE

## 2019-12-07 DIAGNOSIS — I472 Ventricular tachycardia: Secondary | ICD-10-CM

## 2019-12-15 ENCOUNTER — Ambulatory Visit: Admit: 2019-12-15 | Discharge: 2019-12-15 | Payer: MEDICARE

## 2019-12-15 ENCOUNTER — Encounter: Admit: 2019-12-15 | Discharge: 2019-12-15 | Payer: MEDICARE

## 2019-12-15 DIAGNOSIS — I471 Supraventricular tachycardia: Secondary | ICD-10-CM

## 2019-12-15 DIAGNOSIS — M069 Rheumatoid arthritis, unspecified: Secondary | ICD-10-CM

## 2019-12-15 DIAGNOSIS — I472 Ventricular tachycardia: Secondary | ICD-10-CM

## 2019-12-15 DIAGNOSIS — Z95810 Presence of automatic (implantable) cardiac defibrillator: Secondary | ICD-10-CM

## 2019-12-15 DIAGNOSIS — Z8542 Personal history of malignant neoplasm of other parts of uterus: Secondary | ICD-10-CM

## 2019-12-15 DIAGNOSIS — K219 Gastro-esophageal reflux disease without esophagitis: Secondary | ICD-10-CM

## 2019-12-15 DIAGNOSIS — Z01810 Encounter for preprocedural cardiovascular examination: Secondary | ICD-10-CM

## 2019-12-15 MED ORDER — METOPROLOL SUCCINATE 25 MG PO TB24
37.5 mg | ORAL_TABLET | Freq: Every day | ORAL | 3 refills | 90.00000 days | Status: DC
Start: 2019-12-15 — End: 2019-12-22

## 2019-12-15 NOTE — Patient Instructions
1. Increase Toprol XL to 37.5mg  by mouth daily for one week. If tolerating, may increase to 50mg  by mouth daily.   2. We would like you to follow up in  12 months with Dr Steve Rattler     In order to provide you the best care possible we ask that you follow up as below:    For NON-URGENT questions please contact us through your MyChart account.   For all medication refills please contact your pharmacy or send a request through [facility].   For all questions that may need to be addressed urgently please call the nursing triage line at [phone] Monday - Friday 8-5 only. Please leave a detailed message with your name, date of birth, and reason for your call.    To schedule an appointment call [phone].     Please allow 10-15 business days for the results of any testing to be reviewed. Please call our office if you have not heard from a nurse within this time frame.

## 2019-12-15 NOTE — Progress Notes
Date of Service: 12/15/2019    Pamela Hood is a 68 y.o. female.       HPI     Pamela Hood is a pleasant 68 year old woman referred to  [facility] in [place] by Dr. 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  She has rheumatoid arthritis, osteoarthritis, prior history of DVT, nonischemic cardiomyopathy, and congestive heart failure,  LVEF 20%, in absence of coronary disease on coronary angiography, left bundle-branch block in 2016.  She was treated with medical therapy and underwent CRT-D November 07, 2015.  She required some reprogramming for phrenic nerve stimulation.  Since then, ejection fraction most recently checked in March 2020 has improved to 55%.  Heart failure symptoms have resolved.      She was noted to have more than a 20 millisecond run of nonsustained VT on remote transmission November 01, 2019, at approximately 185 beats per minute.  No ICD shocks.  She cannot recall any associated symptoms.     Over the last few months, she has noted intermittent twitching in her abdomen.  This has been low intensity but has become more sustained over the last few months.  She was reprogrammed to avoid phrenic nerve stimulation and feels much improved today.     At her primary care physician visit recently, for low blood pressure, losartan was discontinued.  She denies any lightheadedness, presyncope or syncope. Vital Signs:  Reviewed.  Constitutional:  Well developed wearing face mask.  HEENT:  Normocephalic.  Eyes:  No pallor.  Neck:  Supple.  Pulsatile arterial waveform, no hepatojugular reflux.  Cardiovascular:  Normal rate, regular rhythm.  No rubs, gallops, murmurs.  Lungs:  Clear.  Chest:  Left prepectoral CRT-D incision is intact.  She has thin skin covering the device but no pending erosion.  Abdomen:  Soft.  Musculoskeletal:  No edema.  She has rheumatoid arthritic changes bilateral hands small joints.  Neurologic:  Alert and oriented.  She has slow measured response but is appropriate.  Skin:  Warm and well perfused.  Psych:  Normal mood and affect.       ECG post device reprogramming shows sinus rhythm, left atrial abnormality, 82 beats per minute with tract biventricular paced complexes with QRS duration approximately 84 milliseconds, excellent CRT result.     Device interrogation shows CRT-D implanted November 07, 2015, with 2 years of battery longevity, 99% CRT pacing.  Episode of nonsustained VT November 01, 2019, as per HPI.  Otherwise, lead parameters are stable.  She had lack of capture from D1 electrode.  From M2 and M3, there was phrenic stimulation.  Programming for LV pacing was thus changed from LV2-LV4 to LV4-LV2, and 1 V capture safety margin was programmed by the [employer] clinical rep.     1. CRT-D primary prevention in situ.  2. Phrenic nerve stimulation from LV lead, reprogrammed.  3. Nonsustained VT on device November 01, 2019.    4. Heart failure with recovered ejection fraction.    5. Rheumatoid arthritis.  6. History of DVT. I discussed with her the ejection fraction has improved.  I discussed with her the battery is expected to last approximately 2 years.  At that time, she will be needing generator replacement with option of downgrading to CRT-P if she wanted to forego defibrillator therapy presuming systolic function continues to remain in the normal range as the risk for arrhythmic death would be quite trivial.     For phrenic nerve stimulation,  she was reprogrammed today to a vector with cathode LV4 that did not demonstrate any phrenic stimulation.  Post reprogramming ECG shows continued effective CRT electrical result.  Discussed with her before she leaves the premises to stretch in different ways to ensure lack of any residual phrenic nerve stimulation.  If this recurs in the future, counseled her to reach out to [name] sooner to troubleshoot.     Her losartan was recently discontinued.  Hopefully, systolic function will remain stable but may require to be monitored now that she is just on metoprolol succinate.  For nonsustained VT, we will escalate metoprolol succinate dose.     1. Increase metoprolol succinate from 25 mg daily to 37.5 mg daily for 1 week, if tolerated, further to 50 mg daily long term.    2. Continue remote monitoring on CRT-D.  3. At time of elective replacement indicator, would require echocardiogram, and if ejection fraction in normal range, can consider possibility of downgrading to CRT-P.  4. Routine followup in 1 year.    ([id number])         Vitals:    12/15/19 1045 12/15/19 1106   BP: 122/58 124/58   BP Source: Arm, Left Upper Arm, Right Upper   Patient Position: Sitting Sitting   Pulse: 86    Temp: 36.9 ?C (98.4 ?F)    TempSrc: Oral    SpO2: 98%    Weight: 52.3 kg (115 lb 3.2 oz)    Height: 1.549 m (5' 1)    PainSc: Two      Body mass index is 21.77 kg/m?Marland Kitchen     Past Medical History  Patient Active Problem List    Diagnosis Date Noted   ? Nonsustained supraventricular tachycardia (HCC) 12/15/2019 ? Preoperative cardiovascular examination 02/23/2019   ? Chronic systolic heart failure (HCC) 03/06/2017   ? Vasovagal syncope 05/06/2016   ? ICD (implantable cardioverter-defibrillator), biventricular, in situ 11/07/2015     ? 11/07/15 CRT-D Implantation (no DFTs) - Dr. Wallene Huh - Device programmed around diaphragmatic stimulation      ? NICM (nonischemic cardiomyopathy) (HCC) 11/07/2015   ? Cardiac resynchronization therapy defibrillator (CRT-D) in place 11/07/2015   ? CHF (congestive heart failure), NYHA class III (HCC) 10/04/2015   ? On warfarin therapy 08/24/2015   ? Deep vein thrombosis (DVT) of femoral vein of left lower extremity (HCC) 07/06/2015   ? ACE-inhibitor cough 12/13/2014   ? Preoperative cardiovascular examination 07/29/2014   ? Abnormal thallium stress test 07/29/2014     09/21/2009 Heart cath at [facility]: Normal Coronary arteries and LV function.  08/08/2009: MPI at [facility]:  Study demonstrates evidence of myocardial infarction involving the left anterior descending territory with significant reversible ischemia in this region.  Inferior wall myocardial infarction is noted.  Mild left ventricular systolic dysfunction with wall motion abnormalities as described above.       ? GERD (gastroesophageal reflux disease) 07/29/2014   ? Rheumatoid arthritis (HCC) 07/29/2014   ? History of uterine cancer 07/29/2014   ? Occipital neuralgia 07/29/2014   ? Abnormal EKG 07/29/2014   ? Dilated cardiomyopathy (HCC) 07/29/2014   ? Left bundle branch block (LBBB) 07/29/2014       Review of Systems   Constitution: Negative.   HENT: Positive for hoarse voice.    Eyes: Negative.    Cardiovascular: Positive for leg swelling.   Respiratory: Negative.    Endocrine: Negative.    Hematologic/Lymphatic: Negative.    Skin: Negative.    Musculoskeletal: Positive for arthritis,  back pain, falls, joint pain and joint swelling.   Gastrointestinal: Negative.    Genitourinary: Negative.    Neurological: Negative. Psychiatric/Behavioral: Negative.    Allergic/Immunologic: Negative.        Laboratories  Lab Results   Component Value Date/Time    WBC 4.5 (L) 06/24/2019    HGB 10.2 (L) 06/24/2019    HGB 10.1 (L) 02/18/2019    HCT 32.2 (L) 06/24/2019    PLTCT 189 06/24/2019     Lab Results   Component Value Date/Time    NA 143 06/24/2019    K 4.1 06/24/2019    MG 2.0 11/07/2015 08:00 AM    CA 9.2 06/24/2019    CO2 27.0 06/24/2019    GAP 13 06/24/2019    BUN 19.0 06/24/2019    CR 1.10 06/24/2019    CR 0.88 02/18/2019    GFR >60 02/18/2019    GFRAA >60 02/18/2019    GLU 106 (H) 06/24/2019    GLU 93 02/18/2019    HGBA1C 5.4 02/18/2019    ALBUMIN 3.0 (L) 06/24/2019    TOTPROT 7.5 06/24/2019    ALKPHOS 82 06/24/2019    AST 15 06/24/2019    ALT 9 06/24/2019    TOTBILI 0.36 06/24/2019     No results found for: TSH, TSH3G, FREET4R, FREET4, FREEINDEX, FREET3, T3BINDING, THYBINDGLB, THYROGLB  No results found for: CHOL, TRIG, HDL, LDL  No results found for: TNI, TNIPOC, BNP, BNPPOC  Lab Results   Component Value Date/Time    INR 2.7 06/19/2018    INR 3.0 06/08/2018    INR 2.1 03/17/2018    INR 2.9 01/05/2018       Cardiovascular studies  Lab Results   Component Value Date    ECHOEF 55 02/26/2019    ECHOEF 55 07/24/2018    ECHOEF 65 05/02/2017    ECHOEF 45 05/06/2016    ECHOEF 15 08/16/2015    ECHOEF 20 12/30/2014    ECHOEF 20 07/29/2014    MPIEF 41 08/01/2014       SOCIAL HISTORY:  Ms. Melaragno  reports that she has never smoked. She has never used smokeless tobacco. She reports that she does not drink alcohol or use drugs.       FAMILY HISTORY:  Her family history includes Sudden Cardiac Death in her father.            Current Medications (including today's revisions)  ? ascorbic acid (VITAMIN-C) 500 mg tablet Take 1,000 mg by mouth daily.   ? azaTHIOprine (IMURAN) 50 mg tablet Take 125 mg by mouth daily.   ? CALCIUM CARBONATE/VITAMIN D3 (CALCIUM WITH VITAMIN D PO) Take 1,400 mg by mouth twice daily. ? ferrous sulfate (FEOSOL, FEROSUL) 325 mg (65 mg iron) tablet Take 325 mg by mouth twice daily. Take on an empty stomach at least 1 hour before or 2 hours after food.   ? hydroxychloroquine (PLAQUENIL) 200 mg tablet Take 300 mg by mouth daily.   ? metoprolol XL (TOPROL XL) 25 mg extended release tablet Take 1.5 tablets by mouth daily. Take 37.5mg  by mouth daily for one week then if tolerating may increase to 50mg  by mouth daily.   ? romosozumab-aqqg (EVENITY) 105 mg/1.17 mL syringe Inject 105 mg under the skin every 28 days.

## 2019-12-22 ENCOUNTER — Encounter: Admit: 2019-12-22 | Discharge: 2019-12-22 | Payer: MEDICARE

## 2019-12-22 MED ORDER — METOPROLOL SUCCINATE 50 MG PO TB24
50 mg | ORAL_TABLET | Freq: Every day | ORAL | 3 refills | 90.00000 days | Status: AC
Start: 2019-12-22 — End: ?

## 2019-12-29 ENCOUNTER — Encounter

## 2019-12-29 DIAGNOSIS — I420 Dilated cardiomyopathy: Secondary | ICD-10-CM

## 2020-01-11 ENCOUNTER — Encounter: Admit: 2020-01-11 | Discharge: 2020-01-11 | Payer: MEDICARE

## 2020-02-24 ENCOUNTER — Ambulatory Visit: Admit: 2020-02-24 | Discharge: 2020-02-24 | Payer: MEDICARE

## 2020-02-24 DIAGNOSIS — Z95810 Presence of automatic (implantable) cardiac defibrillator: Secondary | ICD-10-CM

## 2020-02-24 DIAGNOSIS — I447 Left bundle-branch block, unspecified: Secondary | ICD-10-CM

## 2020-02-24 DIAGNOSIS — I5022 Chronic systolic (congestive) heart failure: Secondary | ICD-10-CM

## 2020-02-24 DIAGNOSIS — I420 Dilated cardiomyopathy: Secondary | ICD-10-CM

## 2020-05-25 ENCOUNTER — Encounter: Admit: 2020-05-25 | Discharge: 2020-05-25 | Payer: MEDICARE

## 2020-06-08 ENCOUNTER — Encounter: Admit: 2020-06-08 | Discharge: 2020-06-08 | Payer: MEDICARE

## 2020-06-08 ENCOUNTER — Ambulatory Visit: Admit: 2020-06-08 | Discharge: 2020-06-08 | Payer: MEDICARE

## 2020-06-08 DIAGNOSIS — Z95810 Presence of automatic (implantable) cardiac defibrillator: Secondary | ICD-10-CM

## 2020-08-24 ENCOUNTER — Encounter: Admit: 2020-08-24 | Discharge: 2020-08-25 | Payer: MEDICARE

## 2020-11-07 ENCOUNTER — Encounter: Admit: 2020-11-07 | Discharge: 2020-11-07 | Payer: MEDICARE

## 2020-11-07 MED ORDER — METOPROLOL SUCCINATE 50 MG PO TB24
ORAL_TABLET | Freq: Every day | 3 refills
Start: 2020-11-07 — End: ?

## 2020-11-23 ENCOUNTER — Encounter: Admit: 2020-11-23 | Discharge: 2020-11-23 | Payer: MEDICARE

## 2021-02-15 ENCOUNTER — Encounter: Admit: 2021-02-15 | Discharge: 2021-02-15 | Payer: MEDICARE

## 2021-02-23 ENCOUNTER — Encounter: Admit: 2021-02-23 | Discharge: 2021-02-23 | Payer: MEDICARE

## 2021-05-22 ENCOUNTER — Encounter: Admit: 2021-05-22 | Discharge: 2021-05-22 | Payer: MEDICARE

## 2021-05-22 NOTE — Telephone Encounter
Patient's husband, Iantha Fallen called requesting an office visit with [facility] for cardiac clearance for knee surgery. Patient's last OV with [facility] was 0901/2020 and patient overdue for office visit. Patient scheduled for 9:45 on 05/24/21. Patient and husband aware of time, date and location.

## 2021-05-23 ENCOUNTER — Encounter: Admit: 2021-05-23 | Discharge: 2021-05-23 | Payer: MEDICARE

## 2021-05-24 ENCOUNTER — Encounter: Admit: 2021-05-24 | Discharge: 2021-05-24 | Payer: MEDICARE

## 2021-05-24 DIAGNOSIS — I428 Other cardiomyopathies: Secondary | ICD-10-CM

## 2021-05-24 DIAGNOSIS — R9431 Abnormal electrocardiogram [ECG] [EKG]: Secondary | ICD-10-CM

## 2021-05-24 DIAGNOSIS — M069 Rheumatoid arthritis, unspecified: Secondary | ICD-10-CM

## 2021-05-24 DIAGNOSIS — I82412 Acute embolism and thrombosis of left femoral vein: Secondary | ICD-10-CM

## 2021-05-24 DIAGNOSIS — R058 ACE-inhibitor cough: Secondary | ICD-10-CM

## 2021-05-24 DIAGNOSIS — Z8542 Personal history of malignant neoplasm of other parts of uterus: Secondary | ICD-10-CM

## 2021-05-24 DIAGNOSIS — I447 Left bundle-branch block, unspecified: Secondary | ICD-10-CM

## 2021-05-24 DIAGNOSIS — Z01810 Encounter for preprocedural cardiovascular examination: Secondary | ICD-10-CM

## 2021-05-24 DIAGNOSIS — I5022 Chronic systolic (congestive) heart failure: Secondary | ICD-10-CM

## 2021-05-24 DIAGNOSIS — K219 Gastro-esophageal reflux disease without esophagitis: Secondary | ICD-10-CM

## 2021-05-24 DIAGNOSIS — Z7901 Long term (current) use of anticoagulants: Secondary | ICD-10-CM

## 2021-05-24 DIAGNOSIS — I471 Supraventricular tachycardia: Secondary | ICD-10-CM

## 2021-05-24 DIAGNOSIS — Z95810 Presence of automatic (implantable) cardiac defibrillator: Secondary | ICD-10-CM

## 2021-05-24 DIAGNOSIS — R9439 Abnormal result of other cardiovascular function study: Secondary | ICD-10-CM

## 2021-05-24 DIAGNOSIS — I420 Dilated cardiomyopathy: Secondary | ICD-10-CM

## 2021-05-24 NOTE — Progress Notes
Date of Service: 05/24/2021    Pamela Hood is a 70 y.o. female.       HPI     Patient is a 70 year old white female with a history of dilated cardiomyopathy with severely place 44 request Aulik function, LVEF 20% upon initial diagnosis in August 2015, underlying LBBB, recovered LVEF (the most recent 2D echo Doppler study performed in January 2021 demonstrated normal LV function, mild mitral valve regurgitation, normal right ventricular ejection fraction), status post resynchronization therapy, patient has been continued on goal-directed heart failure therapy, no documented history of coronary artery disease, rheumatoid arthritis and currently on hydroxychloroquine, history of uterine cancer, history of DVT, previously patient was anticoagulated with a vitamin K antagonist.    Patient developed significant left knee arthritis, she is scheduled to undergo orthopedic surgical intervention at [facility] on 06/19/2021.    Today she presents to the office, she is accompanied by her husband, patient is in a wheelchair.    She reports doing well from a cardiac standpoint, she has not experienced any symptoms of chest pain, no heart palpitations, no presyncope or syncope.  The hemodynamic parameters are under good control.  She has continued on metoprolol XL for the underlying cardiomyopathy.  Due to low normal blood pressure, she is not on an ACE inhibitor or an Aldactone inhibitor at the present time.    The most recent device interrogation dated 02/23/2021-did not demonstrate any ventricular arrhythmias, 1 episode of SVT occurred in the lasted less than 10 seconds.       Vitals:    05/24/21 0903   BP: 116/50   BP Source: Arm, Left Upper   Pulse: 88   SpO2: 99%   O2 Percent: 99 %   O2 Device: None (Room air)   PainSc: Zero   Weight: 50.8 kg (112 lb)   Height: 157.5 cm (5' 2)     Body mass index is 20.49 kg/m?Marland Kitchen     Past Medical History  Patient Active Problem List    Diagnosis Date Noted   ? Nonsustained supraventricular tachycardia (HCC) 12/15/2019   ? Preoperative cardiovascular examination 02/23/2019   ? Chronic systolic heart failure (HCC) 03/06/2017   ? Vasovagal syncope 05/06/2016   ? ICD (implantable cardioverter-defibrillator), biventricular, in situ 11/07/2015     ? 11/07/15 CRT-D Implantation (no DFTs) - Dr. Wallene Huh - Device programmed around diaphragmatic stimulation      ? NICM (nonischemic cardiomyopathy) (HCC) 11/07/2015   ? Cardiac resynchronization therapy defibrillator (CRT-D) in place 11/07/2015   ? CHF (congestive heart failure), NYHA class III (HCC) 10/04/2015   ? On warfarin therapy 08/24/2015   ? Deep vein thrombosis (DVT) of femoral vein of left lower extremity (HCC) 07/06/2015   ? ACE-inhibitor cough 12/13/2014   ? Preoperative cardiovascular examination 07/29/2014   ? Abnormal thallium stress test 07/29/2014     09/21/2009 Heart cath at [facility]: Normal Coronary arteries and LV function.  08/08/2009: MPI at [facility]:  Study demonstrates evidence of myocardial infarction involving the left anterior descending territory with significant reversible ischemia in this region.  Inferior wall myocardial infarction is noted.  Mild left ventricular systolic dysfunction with wall motion abnormalities as described above.       ? GERD (gastroesophageal reflux disease) 07/29/2014   ? Rheumatoid arthritis (HCC) 07/29/2014   ? History of uterine cancer 07/29/2014   ? Occipital neuralgia 07/29/2014   ? Abnormal EKG 07/29/2014   ? Dilated cardiomyopathy (HCC) 07/29/2014   ? Left bundle branch  block (LBBB) 07/29/2014         Review of Systems   Constitutional: Positive for malaise/fatigue.   HENT: Negative.    Eyes: Negative.    Cardiovascular: Positive for leg swelling.   Respiratory: Negative.    Endocrine: Negative.    Hematologic/Lymphatic: Negative.    Skin: Negative.    Musculoskeletal: Positive for falls, joint pain, joint swelling, muscle weakness and myalgias.   Gastrointestinal: Negative.    Genitourinary: Negative.    Neurological: Positive for excessive daytime sleepiness, dizziness, light-headedness and weakness.   Psychiatric/Behavioral: Positive for memory loss.   Allergic/Immunologic: Negative.        Physical Exam  General Appearance: Currently in a wheelchair, extensive digits osteoarthritic changes compatible with RA  Skin: warm, moist, no ulcers or xanthomas  Eyes: conjunctivae and lids normal, pupils are equal and round  Lips & Oral Mucosa: no pallor or cyanosis  Neck Veins: neck veins are flat, neck veins are not distended  Chest Inspection: chest is normal in appearance  Respiratory Effort: breathing comfortably, no respiratory distress  Auscultation/Percussion: lungs clear to auscultation, no rales or rhonchi, no wheezing  Cardiac Rhythm: regular rhythm and normal rate  Cardiac Auscultation: S1, S2 normal, no rub, no gallop  Murmurs: no murmur  Carotid Arteries: normal carotid upstroke bilaterally, no bruit  Lower Extremity Edema: no lower extremity edema  Abdominal Exam: soft, non-tender, no masses, bowel sounds normal  Liver & Spleen: no organomegaly  Language and Memory: patient responsive and seems to comprehend information  Neurologic Exam: neurological assessment grossly intact      Cardiovascular Studies    Twelve-lead EKG demonstrates a sensed V paced rhythm, ventricular rate is 86 bpm, no axis deviation, no ST segment T wave changes      Cardiovascular Health Factors  Vitals BP Readings from Last 3 Encounters:   05/24/21 116/50   12/29/19 104/57   12/15/19 124/58     Wt Readings from Last 3 Encounters:   05/24/21 50.8 kg (112 lb)   12/29/19 52.2 kg (115 lb)   12/15/19 52.3 kg (115 lb 3.2 oz)     BMI Readings from Last 3 Encounters:   05/24/21 20.49 kg/m?   12/29/19 21.73 kg/m?   12/15/19 21.77 kg/m?      Smoking Social History     Tobacco Use   Smoking Status Never Smoker   Smokeless Tobacco Never Used      Lipid Profile Cholesterol   Date Value Ref Range Status   08/21/2020 134  Final     HDL Date Value Ref Range Status   08/21/2020 41  Final     LDL   Date Value Ref Range Status   08/21/2020 76  Final     Triglycerides   Date Value Ref Range Status   08/21/2020 83  Final      Blood Sugar Hemoglobin A1C   Date Value Ref Range Status   02/18/2019 5.4  Final     Glucose   Date Value Ref Range Status   05/18/2021 106 (H) 70 - 105 Final   10/18/2020 116 (H) 70 - 105 Final   06/24/2019 106 (H) 70 - 105 Final          Problems Addressed Today  Encounter Diagnoses   Name Primary?   ? Chronic systolic congestive heart failure, NYHA class 3 (HCC) Yes   ? Chronic systolic heart failure (HCC)    ? Deep vein thrombosis (DVT) of femoral vein of left lower  extremity, unspecified chronicity (HCC)    ? Dilated cardiomyopathy (HCC)    ? Left bundle branch block (LBBB)    ? NICM (nonischemic cardiomyopathy) (HCC)    ? Nonsustained supraventricular tachycardia (HCC)    ? Abnormal EKG    ? Abnormal thallium stress test    ? ACE-inhibitor cough    ? History of uterine cancer    ? ICD (implantable cardioverter-defibrillator), biventricular, in situ    ? On warfarin therapy    ? Rheumatoid arthritis involving multiple sites, unspecified whether rheumatoid factor present (HCC)        Assessment and Plan     Assessment:    1.  Preoperative cardiovascular evaluation preceding right knee surgery scheduled on 06/19/2021  2.  Heart failure reduced ejection fraction-upon initial diagnosis in August 2015, patient's LVEF was severely present 20%, the left ventricular was severely dilated  3.  Recovered LVEF-patient was started on goal-directed heart failure therapy according to her hemodynamic parameters, the most recent echocardiogram performed in January 2021 demonstrated normal biventricular function  4.  No evidence of coronary artery disease  5.  LBBB at baseline  6.  Status post resynchronization therapy-the most recent device check performed on 02/23/2021 did not demonstrate any ventricular arrhythmias, patient did have a brief episode of SVT that lasted less than 10 seconds  7.  Rheumatoid arthritis-patient has extensive bilateral digit changes, currently she is on hydroxychloroquine  8.  Previous history of uterine cancer  9.  History of DVT-patient was anticoagulated with a vitamin K antagonist     Plan:    1.  Continue all current medications  2.  From a cardiac standpoint this patient does not have any contraindications to undergo general anesthesia and the recommended orthopedic surgical intervention, in terms of the risks and benefits of this procedure, this will be discussed with the anesthesiologist and the orthopedic surgeon.  From a cardiac standpoint, patient is low risk to undergo this procedure.  ? I do recommend device check prior to hospital discharge  3.  Patient will be evaluated with an echocardiogram in October - November 2022-we will follow-up on the results of this test and will call with further recommendations  4.  Follow-up office visit in 10 to 12 months.    Total Time Today was 45 minutes in the following activities: Preparing to see the patient, Obtaining and/or reviewing separately obtained history, Performing a medically appropriate examination and/or evaluation, Counseling and educating the patient/family/caregiver, Ordering medications, tests, or procedures, Referring and communication with other health care professionals (when not separately reported), Documenting clinical information in the electronic or other health record, Independently interpreting results (not separately reported) and communicating results to the patient/family/caregiver and Care coordination (not separately reported)         Current Medications (including today's revisions)  ? ascorbic acid (VITAMIN C) 1,000 mg tablet Take 1 tablet by mouth daily.   ? CALCIUM CARBONATE/VITAMIN D3 (CALCIUM WITH VITAMIN D PO) Take 1,400 mg by mouth twice daily.   ? ferrous sulfate (FEOSOL, FEROSUL) 325 mg (65 mg iron) tablet Take 325 mg by mouth three times daily. Take on an empty stomach at least 1 hour before or 2 hours after food.   ? hydroxychloroquine (PLAQUENIL) 200 mg tablet Take 300 mg by mouth daily.   ? metoprolol XL (TOPROL XL) 50 mg extended release tablet TAKE 1 TABLET EVERY DAY   ? MULTIVITAMIN PO Take 1 tablet by mouth daily.   ? VITAMIN B-12 1,000 mcg  TbER Take 1 tablet by mouth daily.   ? Zinc 50 mg tab Take 1 tablet by mouth daily.

## 2021-05-29 ENCOUNTER — Encounter: Admit: 2021-05-29 | Discharge: 2021-05-29 | Payer: MEDICARE

## 2021-06-21 ENCOUNTER — Encounter: Admit: 2021-06-21 | Discharge: 2021-06-21 | Payer: MEDICARE

## 2021-06-27 ENCOUNTER — Encounter: Admit: 2021-06-27 | Discharge: 2021-06-27 | Payer: MEDICARE

## 2021-07-06 ENCOUNTER — Encounter: Admit: 2021-07-06 | Discharge: 2021-07-06 | Payer: MEDICARE

## 2021-07-06 NOTE — Telephone Encounter
Received notification that  [employer] has not been connected since 06/19/21. Patient was instructed to look at his/her transmitter to make sure that it is plugged into power and send a manual transmission to reconnect the transmitter. If he/she has any questions about how to send a transmission or if the transmitter does not appear to be working properly, they need to contact the device company directly. Patient was provided with that contact number. Requested the patient send [name] a MyChart message or contact our device nurses at [phone] to let us know after they have sent their transmission. Spoke with patient. She is currently in rehab for knee. She will reconnect when she gets out of rehab to bedside monitor. Patient verbalized understanding.  BC  Note: Patient needs to send a manual remote interrogation to reestablish communication to his/her remote transmitter.

## 2021-07-19 ENCOUNTER — Encounter: Admit: 2021-07-19 | Discharge: 2021-07-19 | Payer: MEDICARE

## 2021-07-19 NOTE — Telephone Encounter
VM from [place] with [facility] reporting that pt is going to get radiation to Right Knee x 15 doses, M-F, hopefully starting next week.  She asked about device info.  Pt does not appear to be dependent but last in office check was June 2021.  Last wireless remote 05/29/2021.  I called [place] back at [phone] and asked that she fax information to [phone] that included start date, # treatments, cGy, scatter so we can form a plan for monitoring CRTD during treatment.  I asked her to call with questions.

## 2021-08-02 ENCOUNTER — Encounter: Admit: 2021-08-02 | Discharge: 2021-08-02 | Payer: MEDICARE

## 2021-08-30 ENCOUNTER — Encounter: Admit: 2021-08-30 | Discharge: 2021-08-30 | Payer: MEDICARE

## 2021-08-30 DIAGNOSIS — Z95810 Presence of automatic (implantable) cardiac defibrillator: Secondary | ICD-10-CM

## 2021-09-12 ENCOUNTER — Encounter: Admit: 2021-09-12 | Discharge: 2021-09-12 | Payer: MEDICARE

## 2021-09-12 ENCOUNTER — Ambulatory Visit: Admit: 2021-09-12 | Discharge: 2021-09-12 | Payer: MEDICARE

## 2021-09-12 DIAGNOSIS — Z95810 Presence of automatic (implantable) cardiac defibrillator: Secondary | ICD-10-CM

## 2021-09-19 ENCOUNTER — Encounter: Admit: 2021-09-19 | Discharge: 2021-09-19 | Payer: MEDICARE

## 2021-09-19 NOTE — Telephone Encounter
-----   Message from Algis Liming sent at 09/19/2021 11:44 AM CDT -----  Regarding: RAD ONC  [place] called from [facility].Patient  is coming in 9/30 to get a CT simulation to do radiation on her right knee. Patient will be having 2-3 weeks of radiation, she is unsure on the exact amount of treatments needed at this time. They are requesting her most recent PPM check. [place] can be reached at [phone].

## 2021-09-19 NOTE — Telephone Encounter
Called [place]. Says needs pre check for pt. Says radiation is not near device. THey will send more information once simulation is complete. Pt will need post radiation check per radiologist.  She will have pt husband schedule.   Fax most recent report.

## 2021-10-16 ENCOUNTER — Encounter: Admit: 2021-10-16 | Discharge: 2021-10-16 | Payer: MEDICARE

## 2021-10-16 ENCOUNTER — Ambulatory Visit: Admit: 2021-10-16 | Discharge: 2021-10-16 | Payer: MEDICARE

## 2021-10-16 DIAGNOSIS — Z95810 Presence of automatic (implantable) cardiac defibrillator: Secondary | ICD-10-CM

## 2021-10-17 ENCOUNTER — Encounter: Admit: 2021-10-17 | Discharge: 2021-10-17 | Payer: MEDICARE

## 2021-10-17 DIAGNOSIS — I428 Other cardiomyopathies: Secondary | ICD-10-CM

## 2021-11-23 ENCOUNTER — Encounter: Admit: 2021-11-23 | Discharge: 2021-11-23 | Payer: MEDICARE

## 2022-02-21 ENCOUNTER — Encounter: Admit: 2022-02-21 | Discharge: 2022-02-21 | Payer: MEDICARE

## 2022-03-04 ENCOUNTER — Encounter: Admit: 2022-03-04 | Discharge: 2022-03-04 | Payer: MEDICARE

## 2022-03-04 MED ORDER — METOPROLOL SUCCINATE 50 MG PO TB24
ORAL_TABLET | 3 refills
Start: 2022-03-04 — End: ?

## 2022-03-27 ENCOUNTER — Encounter: Admit: 2022-03-27 | Discharge: 2022-03-27 | Payer: MEDICARE

## 2022-03-29 ENCOUNTER — Encounter: Admit: 2022-03-29 | Discharge: 2022-03-29 | Payer: MEDICARE

## 2022-03-29 NOTE — Telephone Encounter
-----   Message from Marcha Solders, RN sent at 03/27/2022 12:57 PM CDT -----  Regarding: RE: Alert for shock episode. Shocked for EMI during hip replacement at [facility]. Leane Call.  Hi, Shali Vesey. I discussed the note below with Dr. Avie Arenas.  She agrees with the in-office device check and recommends patient see her at next available OV.    Thank you,  Robin  ----- Message -----  From: Rogelia Boga, RN  Sent: 03/27/2022  10:36 AM CDT  To: Cvm Nurse [place]  Subject: FW: Alert for shock episode. Shocked for EMI#    Hello!    Would you mind showing this alert to Dr. Avie Arenas today.  I have tried to reach the patient.  I am not getting an answer and the voicemail is full.  I will keep trying to see how she is currently doing.  It looks like she was just discharged from [facility].      She is scheduled for an in-person device check on 4/21 in [place]. [place].      I will update you if I am able to reach her, but thought it might be good for [facility] to review sooner than later.    Thank you for your help!  Asher Muir    ----- Message -----  From: Gordy Savers  Sent: 03/27/2022   8:46 AM CDT  To: [facility] Nurse [place]  Subject: Alert for shock episode. Shocked for EMI dur#    A Yellow Alert was reported by the device: Alert for episodes with alert conditions. 03/20/22 multiples AMS and VT/VF episodes. EGMs showed EMI. One shock delivered. 3 aborted shocks. 7 non-sustained V episodes.    I spoke with patient's husband and he stated that patient has hip surgery at [facility] at times of shock/EMI episodes and that was the cause of the EMI/Shock.    Additional Notes:  Normal device function. CorVue is stable. Current rhythm AS-BVP 75 bpm. A few other AMS episodes. Egms showed brief AT/SVT. One other non-sustained V episodes. No egm available.

## 2022-03-29 NOTE — Telephone Encounter
Called and discussed with patient.  She is feeling fine and has not had any issues since she was discharged from [facility].  Pt will keep scheduled appt for device check.  Scheduled pt with [facility] on 5/9 for follow up.  Pt confirmed appt time and location.  She will call us with any questions, concerns or issues.

## 2022-04-12 ENCOUNTER — Encounter: Admit: 2022-04-12 | Discharge: 2022-04-12 | Payer: MEDICARE

## 2022-04-12 ENCOUNTER — Ambulatory Visit: Admit: 2022-04-12 | Discharge: 2022-04-12 | Payer: MEDICARE

## 2022-04-12 DIAGNOSIS — I428 Other cardiomyopathies: Secondary | ICD-10-CM

## 2022-04-30 ENCOUNTER — Ambulatory Visit: Admit: 2022-04-30 | Discharge: 2022-05-01 | Payer: MEDICARE

## 2022-04-30 ENCOUNTER — Encounter: Admit: 2022-04-30 | Discharge: 2022-04-30 | Payer: MEDICARE

## 2022-04-30 DIAGNOSIS — R058 ACE-inhibitor cough: Secondary | ICD-10-CM

## 2022-04-30 DIAGNOSIS — K219 Gastro-esophageal reflux disease without esophagitis: Secondary | ICD-10-CM

## 2022-04-30 DIAGNOSIS — I471 Supraventricular tachycardia: Secondary | ICD-10-CM

## 2022-04-30 DIAGNOSIS — Z95810 Presence of automatic (implantable) cardiac defibrillator: Secondary | ICD-10-CM

## 2022-04-30 DIAGNOSIS — I447 Left bundle-branch block, unspecified: Secondary | ICD-10-CM

## 2022-04-30 DIAGNOSIS — I428 Other cardiomyopathies: Secondary | ICD-10-CM

## 2022-04-30 DIAGNOSIS — Z8542 Personal history of malignant neoplasm of other parts of uterus: Secondary | ICD-10-CM

## 2022-04-30 DIAGNOSIS — I82412 Acute embolism and thrombosis of left femoral vein: Secondary | ICD-10-CM

## 2022-04-30 DIAGNOSIS — M069 Rheumatoid arthritis, unspecified: Secondary | ICD-10-CM

## 2022-04-30 DIAGNOSIS — I5022 Chronic systolic (congestive) heart failure: Secondary | ICD-10-CM

## 2022-04-30 DIAGNOSIS — I420 Dilated cardiomyopathy: Secondary | ICD-10-CM

## 2022-04-30 DIAGNOSIS — Z01810 Encounter for preprocedural cardiovascular examination: Secondary | ICD-10-CM

## 2022-04-30 NOTE — Progress Notes
Date of Service: 04/30/2022    Pamela Hood is a 71 y.o. female.       HPI      Pamela Hood is a 71 y.o. white  female with a history of dilated cardiomyopathy, severe LV dysfunction, LVEF = 20% upon initial diagnosis in August 2015, underlying LBBB, recovered LVEF (the most recent echocardiogram performed in June 2021 demonstrated normal left ventricular size and function, LVEF = 60%), no evidence of obstructive CAD, status post resynchronization therapy, significant physical disability due to a combined osteoarthritis and rheumatoid arthritis, patient continues on hydroxychloroquine, status post orthopedic surgery (right knee and right hip), history of uterine cancer, history of DVT, previously anticoagulated with a BKA.    Since last seen approximately a year ago, patient's overall physical status and level of functioning has deteriorated significantly.  She also lost weight, in June 2022 patient weighed 112 pounds, currently she weighs 100 pounds.    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  She currently does have bilateral lower extremity edema and erythema below the knees, unlikely to be secondary to heart failure, perhaps due 2 knee joint infection.         Vitals:    04/30/22 0945   BP: 131/65   BP Source: Arm, Left Upper   Pulse: 91   SpO2: 100%   O2 Device: None (Room air)   PainSc: Zero   Weight: 45.6 kg (100 lb 9.6 oz)   Height: 157.5 cm (5' 2)     Body mass index is 18.4 kg/m?Marland Kitchen     Past Medical History  Patient Active Problem List    Diagnosis Date Noted   ? Nonsustained supraventricular tachycardia (HCC) 12/15/2019   ? Preop cardiovascular exam 02/23/2019   ? Chronic systolic heart failure (HCC) 03/06/2017   ? Vasovagal syncope 05/06/2016   ? ICD (implantable cardioverter-defibrillator), biventricular, in situ 11/07/2015     ? 11/07/15 CRT-D Implantation (no DFTs) - Dr. Wallene Huh - Device programmed around diaphragmatic stimulation      ? NICM (nonischemic cardiomyopathy) (HCC) 11/07/2015   ? Cardiac resynchronization therapy defibrillator (CRT-D) in place 11/07/2015   ? CHF (congestive heart failure), NYHA class III (HCC) 10/04/2015   ? Deep vein thrombosis (DVT) of femoral vein of left lower extremity (HCC) 07/06/2015   ? ACE-inhibitor cough 12/13/2014   ? Preoperative cardiovascular examination 07/29/2014   ? Abnormal thallium stress test 07/29/2014     09/21/2009 Heart cath at [facility]: Normal Coronary arteries and LV function.  08/08/2009: MPI at [facility]:  Study demonstrates evidence of myocardial infarction involving the left anterior descending territory with significant reversible ischemia in this region.  Inferior wall myocardial infarction is noted.  Mild left ventricular systolic dysfunction with wall motion abnormalities as described above.       ? GERD (gastroesophageal reflux disease) 07/29/2014   ? Rheumatoid arthritis (HCC) 07/29/2014   ? History of uterine cancer 07/29/2014   ? Occipital neuralgia 07/29/2014   ? Abnormal EKG 07/29/2014   ? Dilated cardiomyopathy (HCC) 07/29/2014   ? Left bundle branch block (LBBB) 07/29/2014         Review of Systems   Constitutional: Negative.   HENT: Negative.    Eyes: Negative.    Cardiovascular: Positive for leg swelling.   Respiratory: Negative.    Endocrine: Negative.    Hematologic/Lymphatic: Negative.    Skin: Negative.    Musculoskeletal: Negative.  Gastrointestinal: Negative.    Genitourinary: Negative.    Neurological: Negative.    Psychiatric/Behavioral: Negative.    Allergic/Immunologic: Negative.        Physical Exam  General Appearance:  Week, currently using a wheelchair, extensive digits osteoarthritic changes compatible with RA and OA, obvious weight loss  Skin: warm, moist, no ulcers or xanthomas  Eyes: conjunctivae and lids normal, pupils are equal and round  Lips & Oral Mucosa: no pallor or cyanosis  Neck Veins: neck veins are flat, neck veins are not distended  Chest Inspection: chest is normal in appearance, left upper chest device protrudes underneath the skin due to significant weight loss  Respiratory Effort: breathing comfortably, no respiratory distress  Auscultation/Percussion: lungs clear to auscultation, no rales or rhonchi, no wheezing  Cardiac Rhythm: regular rhythm and normal rate  Cardiac Auscultation: S1, S2 normal, no rub, no gallop  Murmurs: no murmur  Carotid Arteries: normal carotid upstroke bilaterally, no bruit  Lower Extremity Edema: 2+ bilateral lower extremity edema, RLE> LLE, erythema  Abdominal Exam: soft, non-tender, no masses, bowel sounds normal  Liver & Spleen: no organomegaly  Language and Memory: patient responsive and seems to comprehend information  Neurologic Exam: neurological assessment grossly intact    Cardiovascular Studies      Cardiovascular Health Factors  Vitals BP Readings from Last 3 Encounters:   04/30/22 131/65   05/24/21 116/50   12/29/19 104/57     Wt Readings from Last 3 Encounters:   04/30/22 45.6 kg (100 lb 9.6 oz)   05/24/21 50.8 kg (112 lb)   12/29/19 52.2 kg (115 lb)     BMI Readings from Last 3 Encounters:   04/30/22 18.40 kg/m?   05/24/21 20.49 kg/m?   12/29/19 21.73 kg/m?      Smoking Social History     Tobacco Use   Smoking Status Never   Smokeless Tobacco Never      Lipid Profile Cholesterol   Date Value Ref Range Status   08/21/2020 134  Final     HDL   Date Value Ref Range Status   08/21/2020 41  Final     LDL   Date Value Ref Range Status   08/21/2020 76  Final     Triglycerides   Date Value Ref Range Status   08/21/2020 83  Final      Blood Sugar Hemoglobin A1C   Date Value Ref Range Status   02/18/2019 5.4  Final     Glucose   Date Value Ref Range Status   05/18/2021 106 (H) 70 - 105 Final   10/18/2020 116 (H) 70 - 105 Final   06/24/2019 106 (H) 70 - 105 Final          Problems Addressed Today  Encounter Diagnoses   Name Primary?   ? Chronic systolic congestive heart failure, NYHA class 3 (HCC) Yes   ? Deep vein thrombosis (DVT) of femoral vein of left lower extremity, unspecified chronicity (HCC)    ? Dilated cardiomyopathy (HCC)    ? Left bundle branch block (LBBB)    ? NICM (nonischemic cardiomyopathy) (HCC)    ? Nonsustained supraventricular tachycardia (HCC)    ? ACE-inhibitor cough    ? Cardiac resynchronization therapy defibrillator (CRT-D) in place    ? History of uterine cancer    ? ICD (implantable cardioverter-defibrillator), biventricular, in situ    ? Rheumatoid arthritis involving multiple sites, unspecified whether rheumatoid factor present [facility])        Assessment  and Plan     Assessment:    1.  History of heart failure reduced ejection fraction noted covered LVEF  ? Upon initial diagnosis in August 2015 patient's LVEF was 20% with severe LV dilatation  ? The most recent echocardiogram performed in June 2021-normal LVEF  ? Patient continues on metoprolol XL, she is not on other heart failure GDMT regimen due to relative hypotension and unable to tolerate  2.  Known LBBB  3.  Status post resynchronization therapy in November 2015  4.  Abnormal device function  ? On 03/20/2022 multiple acute mild switches VT/VF episodes were noted, 1 shock was delivered, 3 aborted shocks and 7 episodes of nonsustained VT  ? This correlated when patient underwent hip surgery at [facility]  ? The device was interrogated and is functioning normally  5.  Weight loss, low BMI = 18.40 kg/m?  6.  Poor functional status, generalized weakness due to advanced and crippling osteoarthritis/rheumatoid arthritis, status post several orthopedic interventions with complications  7.  History of uterine cancer-currently in remission  8.  History of DVT    Plan:    1.  Continue device remote checks  2.  Continue current dose of metoprolol  3.  I encouraged to maintain an euvolemic status  4.  Follow-up in the office in approximately 10 to 12 months    Total time spent on today's office visit was 40 min. This includes face-to-face in person visit with patient, counseling, as well as nonface-to-face time including review of the EMR, outside records, labs, radiologic studies, echocardiogram & other cardiovascular studies, formulation of treatment plan, after visit summary, future disposition, personal discussions with patient/ family, and  documentation.         Current Medications (including today's revisions)  ? ascorbic acid (vitamin C) 1,000 mg tablet Take one tablet by mouth daily.   ? aspirin EC 81 mg tablet Take one tablet by mouth twice daily.   ? CALCIUM CARBONATE/VITAMIN D3 (CALCIUM WITH VITAMIN D PO) Take 1,400 mg by mouth twice daily.   ? ferrous sulfate (FEOSOL, FEROSUL) 325 mg (65 mg iron) tablet Take one tablet by mouth three times daily. Take on an empty stomach at least 1 hour before or 2 hours after food.   ? hydroxychloroquine (PLAQUENIL) 200 mg tablet Take 1.5 tablets by mouth daily.   ? metoprolol succinate XL (TOPROL XL) 50 mg extended release tablet TAKE 1 TABLET EVERY DAY   ? MULTIVITAMIN PO Take 1 tablet by mouth daily.   ? VITAMIN B-12 1,000 mcg TbER Take one tablet by mouth daily.   ? Zinc 50 mg tab Take one tablet by mouth daily.

## 2022-05-13 ENCOUNTER — Encounter: Admit: 2022-05-13 | Discharge: 2022-05-13 | Payer: MEDICARE

## 2022-05-13 NOTE — Telephone Encounter
Received notification that  [employer] has not been connected since 05/02/22. Patient was instructed to look at his/her transmitter to make sure that it is plugged into power and send a manual transmission to reconnect the transmitter. If he/she has any questions about how to send a transmission or if the transmitter does not appear to be working properly, they need to contact the device company directly. Patient was provided with that contact number. Requested the patient send [name] a MyChart message or contact our device nurses at [phone] to let us know after they have sent their transmission. Placed call to preferred phone number, Spoke with Analiah, Goodell  Is in the hospital 05/11 Left side Hip broken and pen in the hip.( No Fall) . She is in the [facility] ( [address]. Leane Call going to [facility], no ETA on how long she  Will be in rehab   verbalized understanding.  CDJ   Note: Patient needs to send a manual remote interrogation to reestablish communication to his/her remote transmitter.

## 2022-05-19 ENCOUNTER — Encounter: Admit: 2022-05-19 | Discharge: 2022-05-19 | Payer: MEDICARE

## 2022-05-28 ENCOUNTER — Encounter: Admit: 2022-05-28 | Discharge: 2022-05-28 | Payer: MEDICARE

## 2022-05-28 DIAGNOSIS — I5022 Chronic systolic (congestive) heart failure: Secondary | ICD-10-CM

## 2022-05-28 DIAGNOSIS — I428 Other cardiomyopathies: Secondary | ICD-10-CM

## 2022-05-28 DIAGNOSIS — I420 Dilated cardiomyopathy: Secondary | ICD-10-CM

## 2022-05-28 DIAGNOSIS — Z95810 Presence of automatic (implantable) cardiac defibrillator: Secondary | ICD-10-CM

## 2022-05-28 DIAGNOSIS — Z01810 Encounter for preprocedural cardiovascular examination: Secondary | ICD-10-CM

## 2022-05-28 NOTE — Telephone Encounter
Discussed with Dr. Barry Dienes in clinic. He recommends for patient to have an in office device check next available date.     Called and discussed with patient's spouse. He states that patient is currently in a nursing home. Patient needed her hip replaced and patient has been in physical therapy. He states that patient will more than likely be discharged this week. Informed patient's spouse that we can schedule patient next week on the 15th in [place]. He was agreeable to care plan and will reach out if patient is still in nursing home.

## 2022-05-28 NOTE — Telephone Encounter
-----   Message from Sherian Maroon, RN sent at 05/28/2022  4:13 PM CDT -----  Regarding: [facility]-fast HR, possible LV lead issue  remote on 6/1 shows HR on presenting ~115bpm and has been >90bpm ~45% since 04/12/22 device check.   Her LV lead shows questionable capture vs competitiion with intrinsic rhythm.  Her CorVue was down for 21 since then implying HF but is  improved over last 1.5weeks but some of this may be r/t LV setting changes that were made on 4/21. So multiple issues here.  PRobably need to check ICD in person but will need f/u for HR/ HF.  Lemme know if ????

## 2022-06-05 ENCOUNTER — Encounter: Admit: 2022-06-05 | Discharge: 2022-06-05 | Payer: MEDICARE

## 2022-07-02 ENCOUNTER — Ambulatory Visit: Admit: 2022-07-02 | Discharge: 2022-07-02 | Payer: MEDICARE

## 2022-07-02 ENCOUNTER — Encounter: Admit: 2022-07-02 | Discharge: 2022-07-02 | Payer: MEDICARE

## 2022-07-02 DIAGNOSIS — I420 Dilated cardiomyopathy: Secondary | ICD-10-CM

## 2022-07-02 DIAGNOSIS — I428 Other cardiomyopathies: Secondary | ICD-10-CM

## 2022-07-02 DIAGNOSIS — Z95810 Presence of automatic (implantable) cardiac defibrillator: Secondary | ICD-10-CM

## 2022-07-02 DIAGNOSIS — I5022 Chronic systolic (congestive) heart failure: Secondary | ICD-10-CM

## 2022-08-23 ENCOUNTER — Encounter: Admit: 2022-08-23 | Discharge: 2022-08-23 | Payer: MEDICARE

## 2022-08-29 ENCOUNTER — Encounter: Admit: 2022-08-29 | Discharge: 2022-08-29 | Payer: MEDICARE

## 2022-08-29 DIAGNOSIS — Z4502 Encounter for adjustment and management of automatic implantable cardiac defibrillator: Secondary | ICD-10-CM

## 2022-08-29 MED ORDER — SODIUM CHLORIDE 0.9 % IV SOLP
INTRAVENOUS | 0 refills
Start: 2022-08-29 — End: ?

## 2022-08-29 MED ORDER — LIDOCAINE (PF) 10 MG/ML (1 %) IJ SOLN
.2 mL | INTRAMUSCULAR | 0 refills | PRN
Start: 2022-08-29 — End: ?

## 2022-08-29 MED ORDER — CEFAZOLIN INJ 1GM IVP
2 g | Freq: Once | INTRAVENOUS | 0 refills
Start: 2022-08-29 — End: ?

## 2022-08-29 NOTE — Telephone Encounter
-----  Message from Bernette Redbird, RN sent at 08/29/2022 11:47 AM CDT -----  Regarding: FW: Device @ [facility], Schedule Gen Change  Procedure: a Generator Change      Anesthesia: Sedation      Provider: Pt saw ANO in 2020 and YMR and RAD before that. Any provider is fine.  --Ok to schedule with first available provider if needed? Yes      Urgency:  Less Than 1 Month      Date Restrictions per patient? No      Equipment: N/A      Vendor: [employer]      Imaging: n/a      Med Holds: Review Current Medication  No holds    ----- Message -----  From: Katina Degree, RN  Sent: 08/29/2022  11:01 AM CDT  To: Cvm Nurse Ep Team D  Subject: FW: Device @ [facility], Schedule Gen Change              ----- Message -----  From: Patrica Duel, RN  Sent: 08/29/2022   8:41 AM CDT  To: [facility] Nurse [place] [facility]  Subject: Device @ [facility], Schedule Gen Change                We received an alert on pt for the following:    Low battery condition met and device replacement indicator triggered on 08/29/22.  Current battery status: RRT, 0 mos  Current device battery voltage: 2.59V  Replacement Trigger: when current voltage < 2.59 volts  Not an advisory device.  Pt is not dependent.    Please see full report in Epic for details and follow up as needed.    Thanks- Teaching laboratory technician / Device Team

## 2022-08-29 NOTE — Telephone Encounter
Spoke with patient's spouse who manages patient's healthcare, to set up gen change w/ RRR on 10/20. Pre-procedure instructions reviewed, including dates and times of appointments, procedure information, and medication instructions. Kennith requesting instructions be sent via mail.     Pt will get labs drawn at [facility] in [place]. Labs faxed.    Kennith verbalizes understanding has no further questions or concerns.

## 2022-08-30 ENCOUNTER — Encounter: Admit: 2022-08-30 | Discharge: 2022-08-30 | Payer: MEDICARE

## 2022-08-30 DIAGNOSIS — I420 Dilated cardiomyopathy: Secondary | ICD-10-CM

## 2022-08-30 DIAGNOSIS — Z95810 Presence of automatic (implantable) cardiac defibrillator: Secondary | ICD-10-CM

## 2022-08-30 DIAGNOSIS — I5022 Chronic systolic (congestive) heart failure: Secondary | ICD-10-CM

## 2022-08-30 DIAGNOSIS — I447 Left bundle-branch block, unspecified: Secondary | ICD-10-CM

## 2022-08-30 DIAGNOSIS — I428 Other cardiomyopathies: Secondary | ICD-10-CM

## 2022-08-30 NOTE — Patient Instructions
ELECTROPHYSIOLOGY PRE-ADMISSION INSTRUCTIONS    Patient Name: Pamela Hood  MRN#: [id number]  Date of Birth: 05-28-1951 (71 y.o.)  Today's Date: 08/30/2022    PROCEDURE:  You are scheduled for a Generator Change with Dr. Lorain Childes.      ARRIVAL TIME:  Please report to the [facility] admitting office on the ground floor of the [facility] on: Friday, 10/20    The [facility] is located at [address], [place], [place] [address]. Park in [employer] and enter through the  Main front doors to the hospital. The Heart Center is located on the right-hand side as soon as you walk in.    The EP Lab will call to notify you of your arrival time.  They will call on the business day prior to your procedure.  (If you have any questions regarding your arrival time for the Electrophysiology Lab, please call the EP Lab at [phone].)      PRE-PROCEDURE APPOINTMENTS:  10/9 at 3:30 pm Office visit to update history and physical (requirement within 30 days of procedure)  with  Janne Lab, APRN  at [facility]  [facility]. [facility].     Address: [address] [place], [place] [address]     On or after 10/6 Pre-Admission lab work: BMP, CBC, and Magnesium  at [facility] in [place]. Labs have been faxed.         SPECIAL MEDICATION INSTRUCTIONS  Nothing to eat or drink after midnight before your procedure.  Take your prescription medications with a sip of water as instructed.  No caffeine for 24 hours prior to your procedure.        Any new prescriptions will be sent to your pharmacy listed on file with [name].     HOLD ALL over the counter vitamins or supplements on the morning of your procedure.         Additional Instructions  If you wear CPAP, please bring your mask and machine with you to the hospital.    Take a bath or shower with anti-bacterial soap the evening before, or the morning of the procedure. We will give this to you at your office visit.     Bring photo ID and your health insurance card(s).    Arrange for a driver to take you home from the hospital.    Bring an accurate list of your current medications with you to the hospital (all meds and supplements taken daily).    Wear comfortable clothes and don't bring valuables, other than photo identification card, with you to the hospital.    Please pack a bag for an overnight stay.     Please review your pre-procedure instructions and call the office at [phone] with any questions. You may ask to speak with any of Dr. Irwin Brakeman Ramirez's nurses. There are several of [name] in the office that can assist you. For questions regarding post procedure care or restrictions please refer to the Your Care Instructions included in the  a Generator Change Packet.     ALLERGIES  Allergies   Allergen Reactions    Sulfa (Sulfonamide Antibiotics) HIVES       CURRENT MEDICATIONS  Outpatient Encounter Medications as of 08/30/2022   Medication Sig Dispense Refill    ascorbic acid (vitamin C) 1,000 mg tablet Take one tablet by mouth daily.      aspirin EC 81 mg tablet Take one tablet by mouth twice  daily.      CALCIUM CARBONATE/VITAMIN D3 (CALCIUM WITH VITAMIN D PO) Take 1,400 mg by mouth twice daily.      ferrous sulfate (FEOSOL, FEROSUL) 325 mg (65 mg iron) tablet Take one tablet by mouth three times daily. Take on an empty stomach at least 1 hour before or 2 hours after food.      hydroxychloroquine (PLAQUENIL) 200 mg tablet Take 1.5 tablets by mouth daily.      metoprolol succinate XL (TOPROL XL) 50 mg extended release tablet TAKE 1 TABLET EVERY DAY 90 tablet 3    MULTIVITAMIN PO Take 1 tablet by mouth daily.      VITAMIN B-12 1,000 mcg TbER Take one tablet by mouth daily.      Zinc 50 mg tab Take one tablet by mouth daily.       No facility-administered encounter medications on file as of 08/30/2022.     _________________________________________  Form completed by: Darlen Round, RN  Date completed: 08/30/22  Method: Via telephone and mailed to the patient.

## 2022-09-03 ENCOUNTER — Encounter: Admit: 2022-09-03 | Discharge: 2022-09-03 | Payer: MEDICARE

## 2022-09-03 NOTE — Progress Notes
Medicare Primary No pre-certification is required.

## 2022-09-06 IMAGING — CT CHEST W(Adult)
2 of 6 series · 15 of 36 positions shown, 18 images · non-contrast
Comparison: none

[Series 4: thorax cor 1.50 br40 s3 · coronal · 0.62mm/px · 3 of 215 slices shown]
[im 43/215  lung]
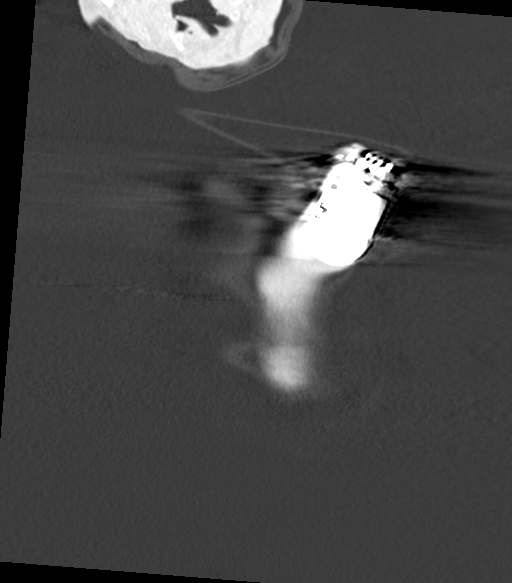
[im 86/215  lung]
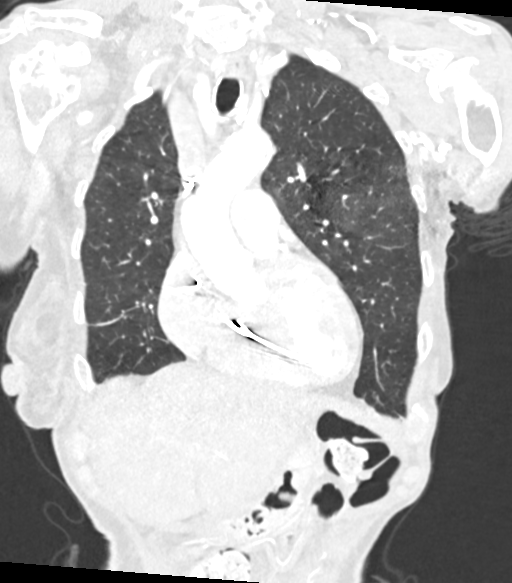
[im 129/215  lung]
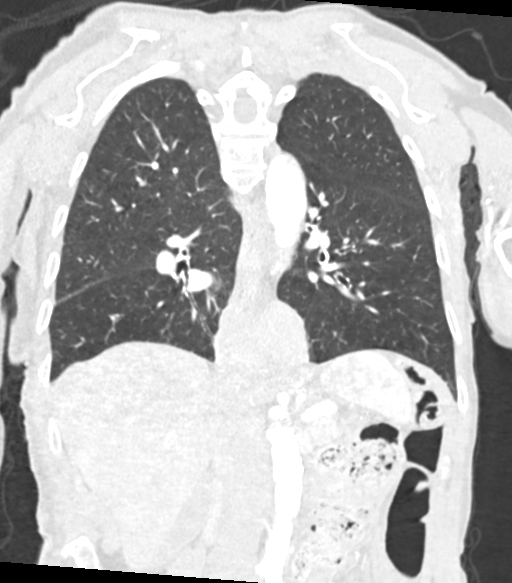

[Series 11: thorax 1.00 br60 s3 · axial · 0.65mm/px · z∈[+1481,+1787]mm · 12 of 517 slices shown, 15 images]
[im 40/517  mediastinal]
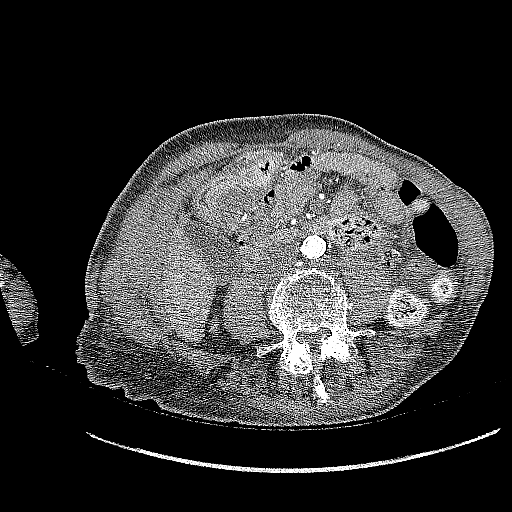
[im 40/517  lung]
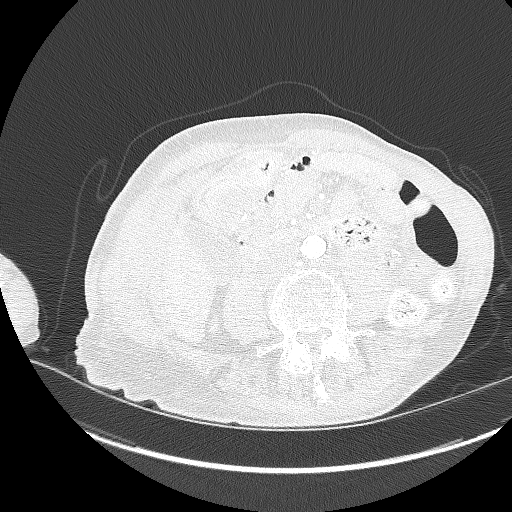
[im 80/517  lung]
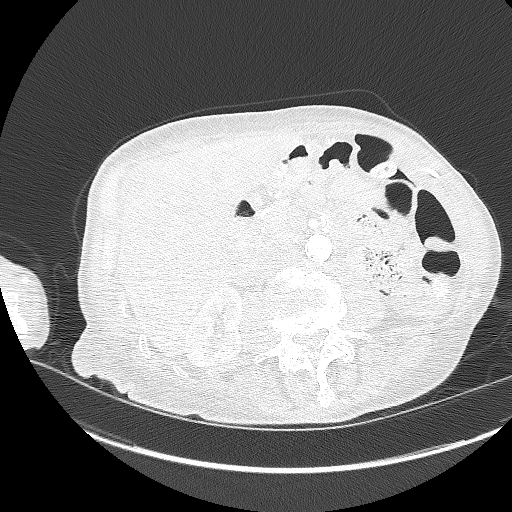
[im 120/517  lung]
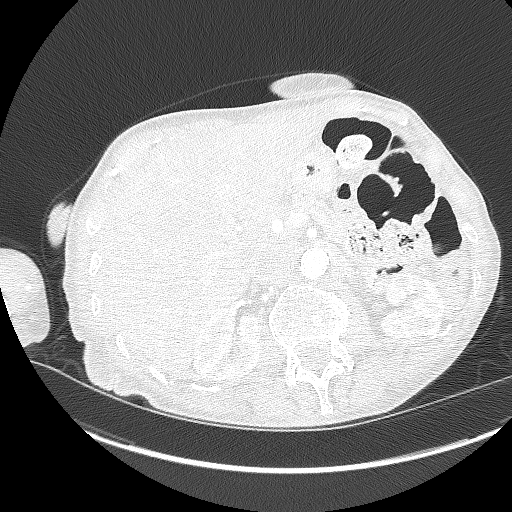
[im 159/517  lung]
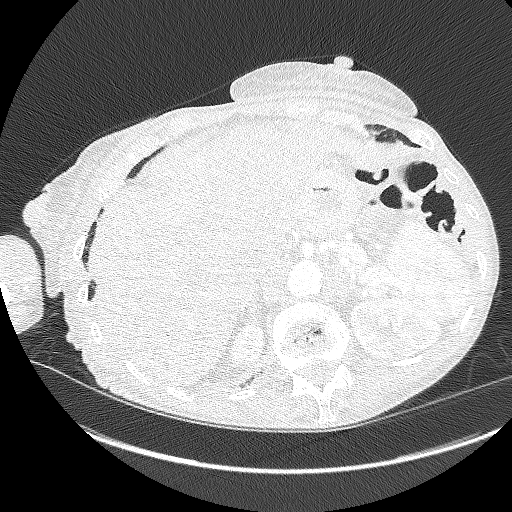
[im 199/517  mediastinal]
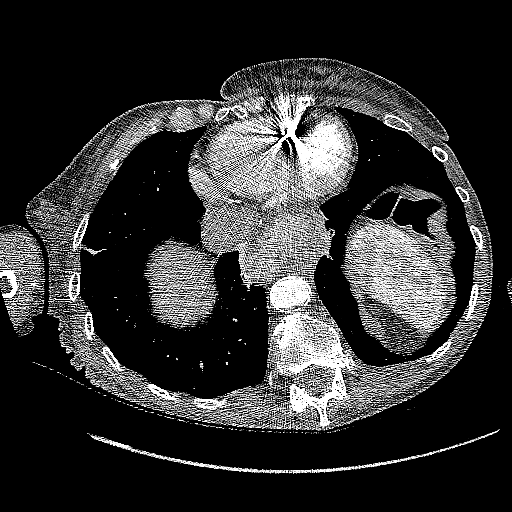
[im 199/517  lung]
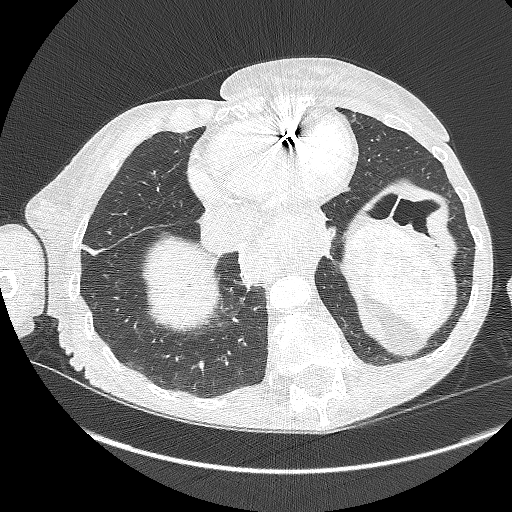
[im 239/517  lung]
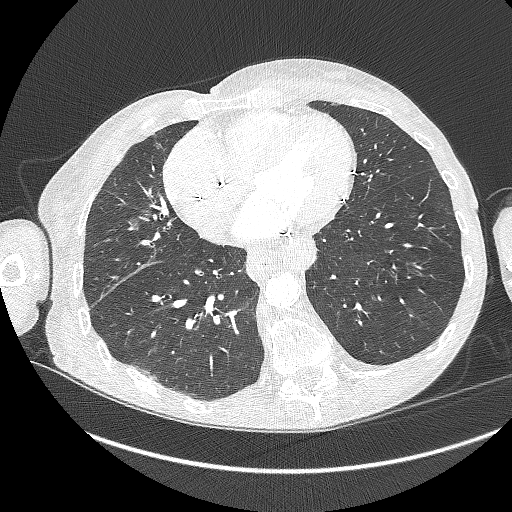
[im 278/517  lung]
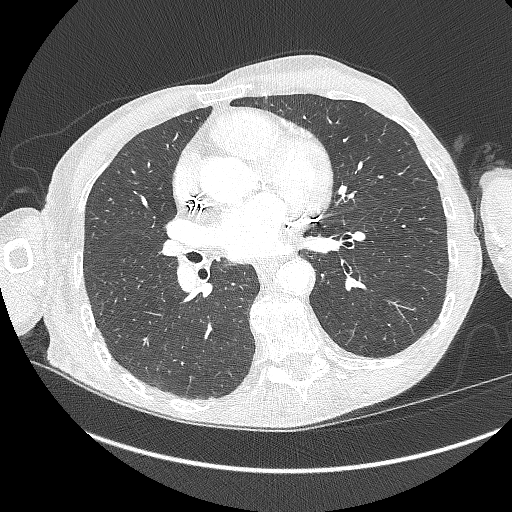
[im 318/517  lung]
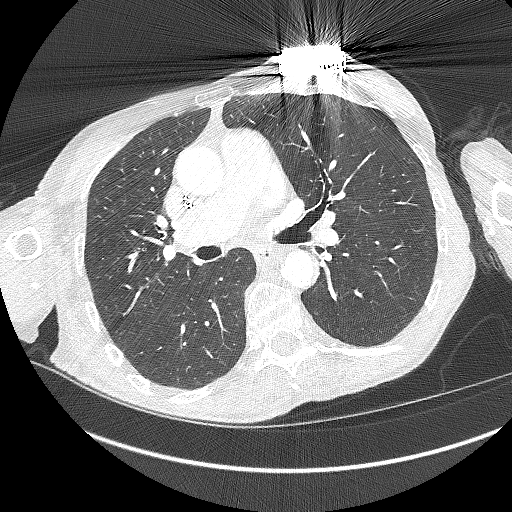
[im 358/517  mediastinal]
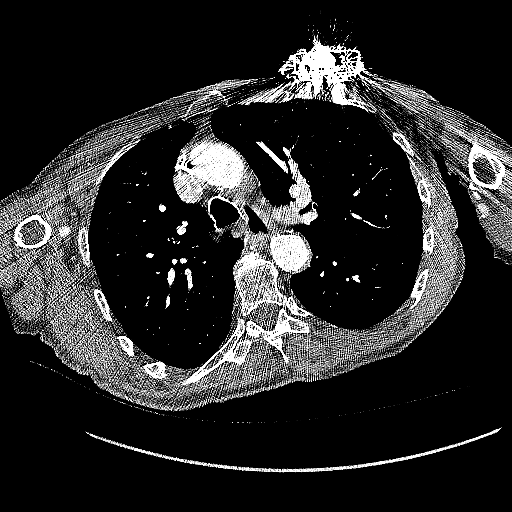
[im 358/517  lung]
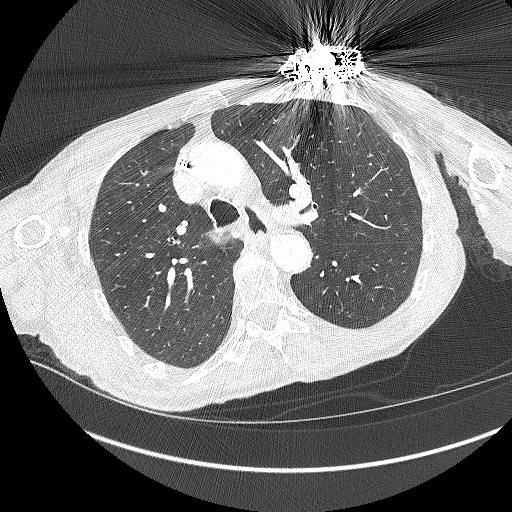
[im 397/517  lung]
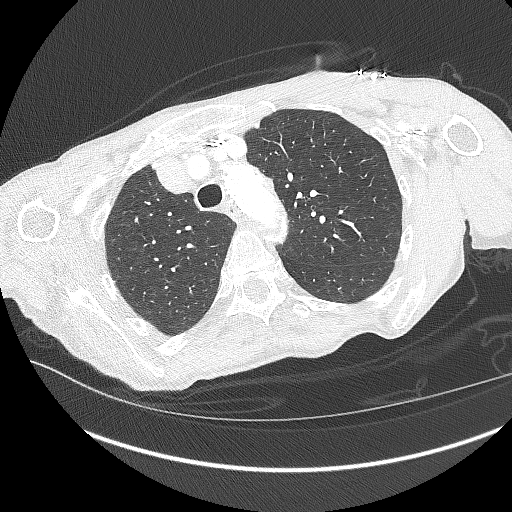
[im 437/517  lung]
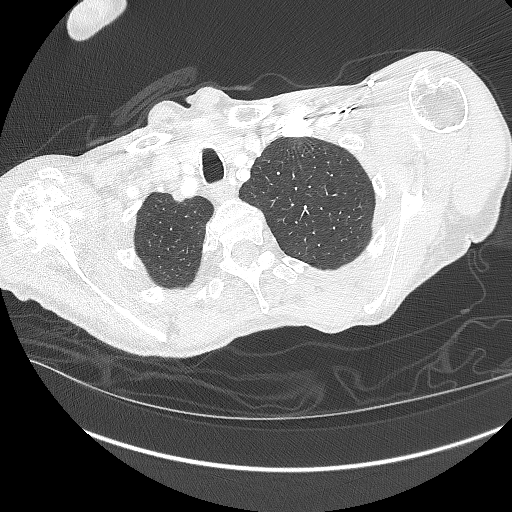
[im 477/517  lung]
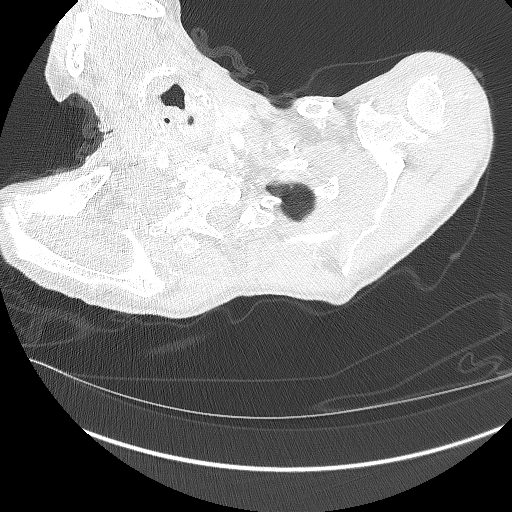

[15 of 36 positions shown; findings below may reference images not displayed]

EXAM

Chest CT with contrast

INDICATION

abnormal weight loss
abnormal wt loss- denies cough, chest pain, creat .87 gfr 40, UOWDIBB 50ml via 20g in lt arm,
pacemaker

FINDINGS

CT of the chest was performed after IV dose of 50 mL of low osmolar Omnipaque 300.

All CT scans at this facility use dose modulation, iterative reconstruction, and/or weight based
dosing when appropriate to reduce radiation dose to as low as reasonably achievable. In the past 12
months, there have been 0 CT scans and 0 nuclear medicine myocardial perfusion scans.

There are no pathologically enlarged mediastinal, hilar or axillary lymph nodes. There are few
slightly prominent reactive axillary nodes bilaterally.

There are no pulmonary nodules.

There are several old well-healed or partially healed rib fractures on the right. There is an area
of interstitial fibrosis in the right middle lobe.

There is no pneumothorax.

There is a moderately sized hiatal hernia.

The adrenals appear normal.

There are chronic appearing compression fractures of the T9-T11 vertebral bodies. There is an old
L1 compression fracture with prior kyphoplasty. There is a chronic appearing L3 compression
fracture.

IMPRESSION

There are no pulmonary nodules. There is no adenopathy.

There are slightly prominent column most likely reactive axillary lymph nodes.

There is a moderately sized hiatal hernia.

There are multiple chronic appearing thoracic and lumbar vertebral body compression fractures.

Tech Notes:

abnormal wt loss- denies cough, chest pain, creat .87 gfr 40, UOWDIBB 50ml via 20g in lt arm,
pacemaker

## 2022-09-30 ENCOUNTER — Encounter: Admit: 2022-09-30 | Discharge: 2022-09-30 | Payer: MEDICARE

## 2022-09-30 DIAGNOSIS — R9439 Abnormal result of other cardiovascular function study: Secondary | ICD-10-CM

## 2022-09-30 DIAGNOSIS — I420 Dilated cardiomyopathy: Secondary | ICD-10-CM

## 2022-09-30 DIAGNOSIS — Z95810 Presence of automatic (implantable) cardiac defibrillator: Secondary | ICD-10-CM

## 2022-09-30 DIAGNOSIS — Z01810 Encounter for preprocedural cardiovascular examination: Secondary | ICD-10-CM

## 2022-09-30 DIAGNOSIS — I447 Left bundle-branch block, unspecified: Secondary | ICD-10-CM

## 2022-09-30 DIAGNOSIS — I428 Other cardiomyopathies: Secondary | ICD-10-CM

## 2022-09-30 DIAGNOSIS — M069 Rheumatoid arthritis, unspecified: Secondary | ICD-10-CM

## 2022-09-30 DIAGNOSIS — I5042 Chronic combined systolic (congestive) and diastolic (congestive) heart failure: Secondary | ICD-10-CM

## 2022-09-30 DIAGNOSIS — I5022 Chronic systolic (congestive) heart failure: Secondary | ICD-10-CM

## 2022-09-30 DIAGNOSIS — Z8542 Personal history of malignant neoplasm of other parts of uterus: Secondary | ICD-10-CM

## 2022-09-30 DIAGNOSIS — K219 Gastro-esophageal reflux disease without esophagitis: Secondary | ICD-10-CM

## 2022-09-30 MED ORDER — METOPROLOL SUCCINATE 50 MG PO TB24
50 mg | ORAL_TABLET | Freq: Every day | ORAL | 3 refills | 90.00000 days | Status: AC
Start: 2022-09-30 — End: ?

## 2022-09-30 NOTE — Progress Notes
Date of Service: 09/30/2022    Pamela Hood is a 71 y.o. female.       HPI     Ms. Pamela Hood was seen in our office otday for a preadmission history and physical for ICD/CRT-D generator change on 10/11/2022 with Dr. Lorain Childes.  She is accompanied today by her husband who is her primary caregiver and handles all of her medications.  Patient was recently at skilled nursing facility but has been home now for several months.  She has a medical history significant for dilated cardiomyopathy, severe LV dysfunction, LVEF = 20% upon initial diagnosis in August 2015, underlying LBBB, recovered LVEF (the most recent echocardiogram performed in June 2021 demonstrated normal left ventricular size and function, LVEF = 60%), no evidence of obstructive CAD, status post resynchronization therapy, significant physical disability due to a combined osteoarthritis and rheumatoid arthritis, patient continues on hydroxychloroquine, status post orthopedic surgery (right knee and right hip), history of uterine cancer, history of DVT, previously anticoagulated, dementia.    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  She currently does have bilateral lower extremity edema and erythema below the knees, unlikely to be secondary to heart failure, perhaps due 2 knee joint infection.    She was last seen in our office on 04/30/2022 by Dr. Avie Arenas.  No changes were made in her medications.  She will continue with routine remote device checks.  Plan was made for her to follow-up in our office in about 10 to 12 months.    Since last visit patient had hospital admission at [facility] 5/24 - 05/18/2022 due to coffee-ground emesis and abdominal pain and subsequently vomiting.  She had also reported dark black stools.  Upon arrival her hemoglobin was 7.7, compared to 7.3 on prior discharge.  Since admission her hemoglobin dropped to 6.8.  A CT abdomen/pelvis was performed and found large amount of colonic stool.  Due to her presenting symptoms and findings she was admitted for further evaluation and management.  GI was consulted and proceeded with an EGD.  She was started on a Protonix drip.  She was transfused with 1 unit PRBCs on 05/15/2022, hemoglobin improved and remained stable.  EGD found grade D esophagitis with stigmata of bleeding.  As hemoglobin remained stable, Protonix drip was changed to IV Protonix.  Hemoglobin improved to 7.9, hematocrit 25.  On discharge she was continued on oral Protonix twice daily.  Plan to repeat EGD in 3 months and referral placed to GI as outpatient.    Today patient reports stable symptoms.  Because of her dementia her husband does answer questions and feels in history.  She does note dyspnea on exertion and has been states that she tends to breathe heavily with walking.  This started around March 2022 and he feels like recently has been less noticeable.  She denies having chest pain, exertional chest pain, PND, and orthopnea.  She does have lower extremity edema without progressive swelling.  Patient does have occasional weeping from her legs.  She does not use compression stockings when her legs weep or she has sores.  She does not remember to elevate her legs and her husband has to constantly remind her.  Patient sleeps in the recliner for comfort in the living room because she will not go into her bedroom.  Husband states that patient has had issues with hallucinations, last episode around June 2022 [place] patient  states that she had recent episode a few weeks back where she feels like he is actually climbing the walls and people are holding her there.  For this reason she cannot sleep in her bed as it is worse when she is in the bedroom.  [place] and dementia started around March 2022.  Patient denies palpitations, racing heartbeats and irregular heart rhythms.  She denies feeling dizzy or lightheaded has had no near syncope, syncopal episodes or falls.  Denies fever, chills and sweats.  Denies TIA or CVA type symptoms.  Denies discharges from her device.  She did see her PCP, Dr.Takett a few weeks ago.  Patient is currently taking oral iron for her anemia.  She denies any GI or GU bleeding but husband states that her bowel movements are almost black.  I explained to him that this is due to her oral iron.  Blood pressure today is 112/54, pulse 80 bpm.    She did have her preadmission laboratories checked on 09/27/2022: Sodium 140, potassium 4.2, chloride 108, CO2 24, BUN 18, creatinine 0.77, glucose 127, calcium 9.5.  Hemoglobin 9.8, hematocrit 31.8, WBCs, RBCs and platelets within normal range.    Assessment and Plan:   1. 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  - Patient does have dependent lower extremity edema without increasing swelling.  Husband reports today that her leg swelling is essentially unchanged and stable at her usual baseline.  - She is incontinent and is not on a diuretic.    2.  Status post resynchronization therapy in November 2015, CRT-D.  - Last remote check on 08/29/22 shows normal device funtion. Device reached RRT on 08/29/2022. A pacing 1%, RV pacing 100%, CRT pacing 99%, AT/AF burden 0%.  -Patient scheduled for device generator change on 10/11/2022 by Dr. Derrell Lolling.  Dr. Derrell Lolling will review risks of the procedure when patient presents for her procedure.  Husband and patient express desire to proceed with generator change as scheduled.    -Preadmission laboratories were checked on 09/27/2022 and acceptable to proceed with device generator change as planned with sodium 140, potassium 4.2, creatinine 0.77, magnesium 2.0, hemoglobin 9.8 (up from 7.9 in May 2023).    3.  Abnormal device function.   -On 03/20/2022 multiple acute mode switches VT/VF episodes were noted, 1 shock was delivered, 3 aborted shocks and 7 episodes of nonsustained VT  - This correlated when patient underwent hip surgery at [facility]  - The device was interrogated and is functioning normally.    4.  Known LBBB.    5.  Weight loss, low BMI = 19.3.    6.  Poor functional status, generalized weakness due to advanced and crippling osteoarthritis/rheumatoid arthritis, status post several orthopedic interventions with complications    7.  History of uterine cancer-currently in remission    8.  History of DVT    9.   Recent episode of GI bleeding with esophagitis with stigmata of bleeding on EGD GI was consulted and patient was placed on Protonix with plan to repeat EGD in 3 months.  CBC is followed  by GI.    10.  Anemia.  Followed by PCP.  Laboratories checked on 10/6 showed a hemoglobin of 9.8, hematocrit 31.8, WBCs, RBCs and platelets within normal range.  Patient does take oral iron.    11.  Dementia with associated hallucinations.    -Continue current medications, no changes.  -Proceed with CRT-D generator change as scheduled on 10/11/2022.  We reviewed and discussed her preadmission instructions at her visit today and she was provided a printed copy of these instructions.  -Preadmission labs checked on 09/27/2022-all acceptable to proceed with generator change as scheduled.  -Continue routine remote device checks and twice a year in the office.  -Check daily weight, blood pressure and pulse and keep diary to bring to each visit.  - Instructed to call office for weight gain of 3 pounds overnight or 5 pounds in 1 week.  - 2 g sodium restricted diet  - 2 L fluid restriction.  -Encouraged to increase walking and always her use walker  -Instructed to contact Dr. Jacinto Halim nursing team if she has questions or concerns regarding her defibrillator.  - Encouraged to follow-up with Dr. Alona Bene, PCP regarding ongoing evaluation and treatment of anemia.  - Elevate legs at least 30 minutes 3 times daily and while sitting to help with lower extremity edema.  - Encourage use of compression stockings, on during the day and off at night only when there are no blisters or open sores.  Otherwise leave open to air.  - Encouraged to apply emollient cream (such as Vanicream, CeraVe or Eucerin) to legs twice daily and to avoid any blisters or open sores.    Follow Up: Dr. Avie Arenas in 3-4 months.  Patient is encouraged to contact our office if she has problems prior to next visit    I have educated the patient/family on the plan of care today.  Patient/family verbalized understanding and agreement with the plan.  Instructions are outlined in the after visit summary document.     Thank you for the opportunity to participate in the care of your patient.  If you have any questions or concerns please do not hesitate to contact me.     I spent 55 minutes on this encounter including time for chart review including OSH records, physical exam, assessment, formation of treatment plan , follow up and documentation.  I reviewed his/her most recent laboratory, radiologic and cardiovascular test results  at the time of patient's visit.  I reviewed and discussed his/her heart disease, medication instructions, medication interactions,  exercise, diet, lipid treatment, blood pressure monitoring, blood pressure goals, diet/ low sodium diet, daily weights, weight reduction,exercise, s/s fluid build up/volume overload, reviewed heart failure zones, follow up plan.  We reviewed the above treatment plan, went over risks/benefits/alternatives to the therapies, and all questions were answered to his/her satisfaction.  Educated patient on importance of compliance with recommended medical therapies and keeping follow-up appointments.                DRB  Vitals:    09/30/22 0856   BP: 112/54   BP Source: Arm, Right Upper   Pulse: 80   SpO2: 97%   O2 Device: None (Room air)   PainSc: Four   Weight: 48.1 kg (106 lb)   Height: 157.5 cm (5' 2)     Body mass index is 19.39 kg/m?Marland Kitchen     Past Medical History  Patient Active Problem List    Diagnosis Date Noted   ? Nonsustained supraventricular tachycardia  12/15/2019   ? Preop cardiovascular exam 02/23/2019   ? Chronic combined systolic and diastolic heart failure (HCC) 03/06/2017   ? Vasovagal syncope 05/06/2016   ? ICD (implantable cardioverter-defibrillator), biventricular, in situ 11/07/2015     ? 11/07/15 CRT-D Implantation (no DFTs) - Dr. Wallene Huh - Device programmed around diaphragmatic stimulation      ? NICM (nonischemic cardiomyopathy) (HCC) 11/07/2015   ? Cardiac resynchronization therapy defibrillator (CRT-D) in place 11/07/2015   ? CHF (congestive heart failure), NYHA class III (HCC) 10/04/2015   ? Deep vein thrombosis (DVT) of femoral vein of left lower extremity (HCC) 07/06/2015   ? ACE-inhibitor cough 12/13/2014   ? Preoperative cardiovascular examination 07/29/2014   ? Abnormal thallium stress test 07/29/2014     09/21/2009 Heart cath at [facility]: Normal Coronary arteries and LV function.  08/08/2009: MPI at [facility]:  Study demonstrates evidence of myocardial infarction involving the left anterior descending territory with significant reversible ischemia in this region.  Inferior wall myocardial infarction is noted.  Mild left ventricular systolic dysfunction with wall motion abnormalities as described above.       ? GERD (gastroesophageal reflux disease) 07/29/2014   ? Rheumatoid arthritis (HCC) 07/29/2014   ? History of uterine cancer 07/29/2014   ? Occipital neuralgia 07/29/2014   ? Abnormal EKG 07/29/2014   ? Dilated cardiomyopathy (HCC) 07/29/2014   ? Left bundle branch block (LBBB) 07/29/2014         Review of Systems   Constitutional: Negative.   HENT: Negative.    Eyes: Negative.    Cardiovascular: Negative.    Respiratory: Negative.    Endocrine: Negative.    Hematologic/Lymphatic: Negative.    Skin: Negative.    Musculoskeletal: Negative.    Gastrointestinal: Negative.    Genitourinary: Negative.    Neurological: Negative.    Psychiatric/Behavioral: Negative.    Allergic/Immunologic: Negative.        Physical Exam  Vital signs were reviewed.   General Appearance: appears thin and frail, appears relaxed, in no acute distress, BMI 19.3,  extensive digits osteoarthritic changes with nodules compatible with RA and OA,  Skin: warm, moist, intact, no rash or lesions, no xanthomas, subclavian device incision well-healed with scar  HEENT: unremarkable, pupils equal and round, no scleral icterus, conjunctivae and lids normal, wears glasses  Lips & Mouth: no pallor or cyanosis  Neck Veins: JVP normal, JVP is not elevated above the sternal notch,neck veins are flat, neck veins are not distended   Carotid Arteries: normal carotid upstroke bilaterally, no bruits bilaterally  Chest Inspection: chest is normal in appearance  Auscultation/Percussion/Effort: lungs clear to auscultation, no rales, rhonchi, or wheezing, respirations even and unlabored, no respiratory distress  Cardiac Rhythm: regular rhythm and normal rate   Cardiac Auscultation: normal S1 & S2, no S3 or S4, no rub   Murmurs: no cardiac murmurs   Extremities:2+  BLE pretibial edema with scaling and few scattered blisters with some seeping on RLE, no evidence of cellulitis or skin sores, RLE > LLE, 2+ symmetric distal pulses   Abdominal Exam: soft, non-tender,non-distended, no obvious masses, bowel sounds normal, no guarding, no abdominal bruits  Liver & Spleen: no organomegaly   Neurologic Exam: grossly intact, alert, moves all extremities equally   Orientation: oriented to person and place  Gait: slow, cautious, ambulates with assistance of a walker  Language & Memory: speech clear, patient responsive to direct questions, seems to comprehend information  Psych: appropriate mood and affect, thought content and behavior normal  Cardiovascular Health Factors  Vitals BP Readings from Last 3 Encounters:   09/30/22 112/54   04/30/22 131/65   05/24/21 116/50     Wt Readings from Last 3 Encounters:   09/30/22 48.1 kg (106 lb)   04/30/22 45.6 kg (100 lb 9.6 oz)   05/24/21 50.8 kg (112 lb)     BMI Readings from Last 3 Encounters:   09/30/22 19.39 kg/m?   04/30/22 18.40 kg/m?   05/24/21 20.49 kg/m?      Smoking Social History     Tobacco Use   Smoking Status Never   Smokeless Tobacco Never      Lipid Profile Cholesterol   Date Value Ref Range Status   08/21/2020 134  Final     HDL   Date Value Ref Range Status   08/21/2020 41  Final     LDL   Date Value Ref Range Status   08/21/2020 76  Final     Triglycerides   Date Value Ref Range Status   08/21/2020 83  Final      Blood Sugar Hemoglobin A1C   Date Value Ref Range Status   02/18/2019 5.4  Final     Glucose   Date Value Ref Range Status   09/27/2022 127 (H) 70 - 105 Final   05/18/2021 106 (H) 70 - 105 Final   10/18/2020 116 (H) 70 - 105 Final          Problems Addressed Today  Encounter Diagnoses   Name Primary?   ? Chronic combined systolic and diastolic heart failure (HCC) Yes   ? Dilated cardiomyopathy (HCC)    ? NICM (nonischemic cardiomyopathy) (HCC)    ? Left bundle branch block (LBBB)    ? Abnormal thallium stress test    ? ICD (implantable cardioverter-defibrillator), biventricular, in situ    ? Cardiac resynchronization therapy defibrillator (CRT-D) in place    ? Rheumatoid arthritis involving multiple sites, unspecified whether rheumatoid factor present (HCC)                      Current Medications (including today's revisions)  ? acetaminophen SR (TYLENOL 8 HOUR) 650 mg tablet Take one tablet by mouth every 8 hours as needed for Pain.   ? ascorbic acid (vitamin C) 1,000 mg tablet Take one tablet by mouth daily. ? aspirin EC 81 mg tablet Take one tablet by mouth daily.   ? CALCIUM CARBONATE/VITAMIN D3 (CALCIUM WITH VITAMIN D PO) Take 1,400 mg by mouth twice daily.   ? ferrous sulfate (FEOSOL) 325 mg (65 mg iron) tablet Take one tablet by mouth daily. Take on an empty stomach at least 1 hour before or 2 hours after food.   ? hydroxychloroquine (PLAQUENIL) 200 mg tablet Take one tablet by mouth daily.   ? metoprolol succinate XL (TOPROL XL) 50 mg extended release tablet TAKE 1 TABLET EVERY DAY   ? MULTIVITAMIN PO Take 1 tablet by mouth daily.   ? pantoprazole DR (PROTONIX) 40 mg tablet Take one tablet by mouth twice daily.   ? VITAMIN B-12 1,000 mcg TbER Take one tablet by mouth daily.   ? Zinc 50 mg tab Take one tablet by mouth daily.

## 2022-10-10 ENCOUNTER — Encounter: Admit: 2022-10-10 | Discharge: 2022-10-10 | Payer: MEDICARE

## 2022-10-11 ENCOUNTER — Encounter: Admit: 2022-10-11 | Discharge: 2022-10-11 | Payer: MEDICARE

## 2022-10-11 MED ADMIN — CEFAZOLIN INJ 1GM IVP [210319]: 2 g | INTRAVENOUS | @ 13:00:00 | Stop: 2022-10-11 | NDC 00143992490

## 2022-10-11 MED ADMIN — SODIUM CHLORIDE 0.9 % IV SOLP [27838]: 500 mL | @ 13:00:00 | Stop: 2022-10-11 | NDC 00338004904

## 2022-10-11 MED ADMIN — SODIUM CHLORIDE 0.9 % IV SOLP [27838]: 1000.000 mL | INTRAVENOUS | @ 12:00:00 | Stop: 2022-10-11 | NDC 00338004904

## 2022-10-11 MED ADMIN — CEFAZOLIN 1 GRAM IJ SOLR [1445]: 500 mL | @ 13:00:00 | Stop: 2022-10-11 | NDC 00143992490

## 2022-10-18 ENCOUNTER — Encounter: Admit: 2022-10-18 | Discharge: 2022-10-18 | Payer: MEDICARE

## 2022-10-18 NOTE — Progress Notes
Left prepectoral incision is clean, dry, well approximated, and healing without evidence of drainage or discharge. Pt reports no adverse symptoms. Incision care, including signs and symptoms of infection, reviewed(as below). Pt verbalized understanding and will remain in phone contact.     You may shower once dressing removed at follow up appointment; however avoid direct contact with the incision (allow the water to hit the back of your shoulder rather than directly on the incision).     Do not submerge incision in tub, pool, hot tub, or lake for 4 weeks.     Unless your incision is bleeding or draining, keep it open to air.     Avoid applying deodorants, powders, creams, lotions, etc. to your incision for 4 weeks.     Usually there are no stitches to be removed. Steri-strips will begin to fall off in 10-14 days. If they remain after 2 weeks, gently remove them when they are damp after a shower.     Your incision should gradually look better each day. Please notify our office immediately if you notice any of the following:   -an increase in swelling or redness   -any drainage   -increasing pain at the incision site   -fever over 100 degrees

## 2022-11-05 ENCOUNTER — Encounter: Admit: 2022-11-05 | Discharge: 2022-11-05 | Payer: MEDICARE

## 2022-11-18 ENCOUNTER — Encounter: Admit: 2022-11-18 | Discharge: 2022-11-18 | Payer: MEDICARE

## 2022-11-22 ENCOUNTER — Encounter: Admit: 2022-11-22 | Discharge: 2022-11-22 | Payer: MEDICARE

## 2022-11-28 ENCOUNTER — Encounter: Admit: 2022-11-28 | Discharge: 2022-11-28 | Payer: MEDICARE

## 2022-11-28 DIAGNOSIS — I447 Left bundle-branch block, unspecified: Secondary | ICD-10-CM

## 2022-11-28 DIAGNOSIS — Z8542 Personal history of malignant neoplasm of other parts of uterus: Secondary | ICD-10-CM

## 2022-11-28 DIAGNOSIS — R600 Localized edema: Secondary | ICD-10-CM

## 2022-11-28 DIAGNOSIS — Z95810 Presence of automatic (implantable) cardiac defibrillator: Secondary | ICD-10-CM

## 2022-11-28 DIAGNOSIS — I428 Other cardiomyopathies: Secondary | ICD-10-CM

## 2022-11-28 DIAGNOSIS — M069 Rheumatoid arthritis, unspecified: Secondary | ICD-10-CM

## 2022-11-28 DIAGNOSIS — Z01810 Encounter for preprocedural cardiovascular examination: Secondary | ICD-10-CM

## 2022-11-28 DIAGNOSIS — K219 Gastro-esophageal reflux disease without esophagitis: Secondary | ICD-10-CM

## 2022-11-28 NOTE — Progress Notes
Date of Service: 11/28/2022    Pamela Hood is a 71 y.o. female.       HPI     Patient is a 71 year old Caucasian female past medical history of advanced rheumatoid arthritis that has been somewhat disabling and led to multiple joint replacements and repairs.  She was found to have a nonischemic cardiomyopathy in 2016 and significant left bundle branch block.  She was managed by medical therapy and ultimately had biventricular ICD implanted.  Left ventricular ejection fraction recovered to normal and has been stable.  She does not struggle with orthopnea, palpitations, or lightheadedness.  In the past year has had extensive tense edema in the lower legs.  Is currently being managed by specialized compressive treatment and Unna boots.  Because of her arthritis is now having severe right shoulder discomfort and does have plans for potential surgery within the next month.  Most recent generator exchange was in October 2023 without difficulty.  Device check on December 1 showed stable atrial sensed and biventricular paced rhythm with no significant conduction abnormalities or findings for hypervolemia.         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 Wallene Huh - Device programmed around diaphragmatic stimulation       NICM (nonischemic cardiomyopathy) (HCC) 11/07/2015    Cardiac resynchronization therapy defibrillator (CRT-D) in place 11/07/2015    CHF (congestive heart failure), NYHA class III (HCC) 10/04/2015    Deep vein thrombosis (DVT) of femoral vein of left lower extremity (HCC) 07/06/2015    ACE-inhibitor cough 12/13/2014    Preoperative cardiovascular examination 07/29/2014    Abnormal thallium stress test 07/29/2014     09/21/2009 Heart cath at [facility]: Normal Coronary arteries and LV function.  08/08/2009: MPI at [facility]:  Study demonstrates evidence of myocardial infarction involving the left anterior descending territory with significant reversible ischemia in this region.  Inferior wall myocardial infarction is noted.  Mild left ventricular systolic dysfunction with wall motion abnormalities as described above.        GERD (gastroesophageal reflux disease) 07/29/2014    Rheumatoid arthritis (HCC) 07/29/2014    History of uterine cancer 07/29/2014    Occipital neuralgia 07/29/2014    Abnormal EKG 07/29/2014    Dilated cardiomyopathy (HCC) 07/29/2014    Left bundle branch block (LBBB) 07/29/2014         Review of Systems   Constitutional: Negative.   HENT: Negative.     Eyes: Negative.    Cardiovascular: Negative.    Respiratory: Negative.     Endocrine: Negative.    Hematologic/Lymphatic: Negative.    Skin: Negative.    Musculoskeletal: Negative.    Gastrointestinal: Negative.    Genitourinary: Negative.    Neurological: Negative.    Psychiatric/Behavioral: Negative.  Allergic/Immunologic: Negative.        Physical Exam  Awake alert, orient x 3, accompanied by her husband  Sitting in chair some mild repetitive smacking, does have a walker  Pupils are equal react without scleral injection  Neck is supple with normal carotid upstroke and no significant bruits  Chest is symmetric and lungs are clear to auscultation  Heart S1, S2 better slightly increased but no murmur, click, or gallop  Abdomen is flat nontender  Has significant deviation enlargement of her interphalangeal joints with mild reddening  Pulse are 2+ and regular at the radial location  Has extensive dressing compression style on the lower legs      Cardiovascular Studies      Cardiovascular Health Factors  Vitals BP Readings from Last 3 Encounters:   11/28/22 120/70   10/11/22 116/48   09/30/22 112/54     Wt Readings from Last 3 Encounters:   11/28/22 51.9 kg (114 lb 6.4 oz)   10/11/22 45.4 kg (100 lb 1.4 oz)   09/30/22 48.1 kg (106 lb)     BMI Readings from Last 3 Encounters:   11/28/22 21.62 kg/m?   10/11/22 19.55 kg/m?   09/30/22 19.39 kg/m?      Smoking Social History     Tobacco Use   Smoking Status Never   Smokeless Tobacco Never      Lipid Profile Cholesterol   Date Value Ref Range Status   08/21/2020 134  Final     HDL   Date Value Ref Range Status   08/21/2020 41  Final     LDL   Date Value Ref Range Status   08/21/2020 76  Final     Triglycerides   Date Value Ref Range Status   08/21/2020 83  Final      Blood Sugar Hemoglobin A1C   Date Value Ref Range Status   02/18/2019 5.4  Final     Glucose   Date Value Ref Range Status   09/27/2022 127 (H) 70 - 105 Final   05/18/2021 106 (H) 70 - 105 Final   10/18/2020 116 (H) 70 - 105 Final          Problems Addressed Today  Encounter Diagnoses   Name Primary?    Left bundle branch block (LBBB) Yes    NICM (nonischemic cardiomyopathy) (HCC)     Preoperative cardiovascular examination     Biventricular ICD (implantable cardioverter-defibrillator) in place     Localized edema        Assessment and Plan     Heart and vascular findings are clinically stable.  She is compliant tolerant medications.  No findings for unstable cardiac abnormality.  At this time no changes to medical therapy with routine device checks.  She is currently low risk for complications with the planned shoulder surgery.  Advise caution with avoiding placement of the grounding pad at or close to the generator pack from the device in the left upper chest.  May hold aspirin if needed.  Routine follow-up in the clinic can be in approximately 6 months. Current Medications (including today's revisions)   acetaminophen SR (TYLENOL 8 HOUR) 650 mg tablet Take one tablet by mouth every 8 hours as needed for Pain.    ascorbic acid (vitamin C) 1,000 mg tablet Take one tablet by mouth daily.    aspirin EC 81 mg tablet Take one tablet by mouth daily.    CALCIUM CARBONATE/VITAMIN D3 (CALCIUM WITH VITAMIN D PO) Take 1,400 mg  by mouth twice daily.    ferrous sulfate (FEOSOL) 325 mg (65 mg iron) tablet Take one tablet by mouth daily. Take on an empty stomach at least 1 hour before or 2 hours after food.    hydroxychloroquine (PLAQUENIL) 200 mg tablet Take one tablet by mouth daily.    metoprolol succinate XL (TOPROL XL) 50 mg extended release tablet Take one tablet by mouth daily.    MULTIVITAMIN PO Take 1 tablet by mouth daily.    pantoprazole DR (PROTONIX) 40 mg tablet Take one tablet by mouth twice daily.    VITAMIN B-12 1,000 mcg TbER Take one tablet by mouth daily.    Zinc 50 mg tab Take one tablet by mouth daily.

## 2022-12-19 ENCOUNTER — Encounter: Admit: 2022-12-19 | Discharge: 2022-12-19 | Payer: MEDICARE

## 2022-12-19 NOTE — Telephone Encounter
12/19/2022 1:06 PM     Received voicemail from [place] at [facility] requesting information for cardiac clearance be faxed to [phone]. Recent office visit note faxed for clearance for shoulder surgery.

## 2022-12-20 IMAGING — US ADUPLEBABI
1 series · 14 of 25 positions shown · non-contrast
Comparison: none

[Series 1: us arterial dup bil leg w abi · arterial · 14 of 63 slices shown]
[im 1/63]
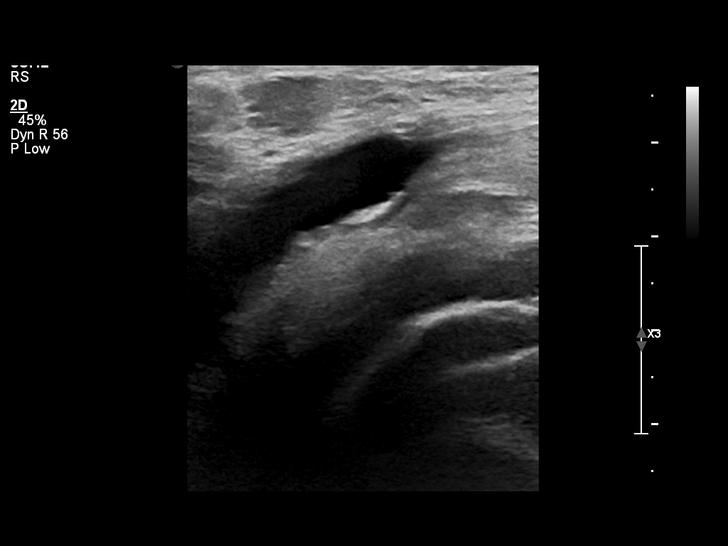
[im 6/63]
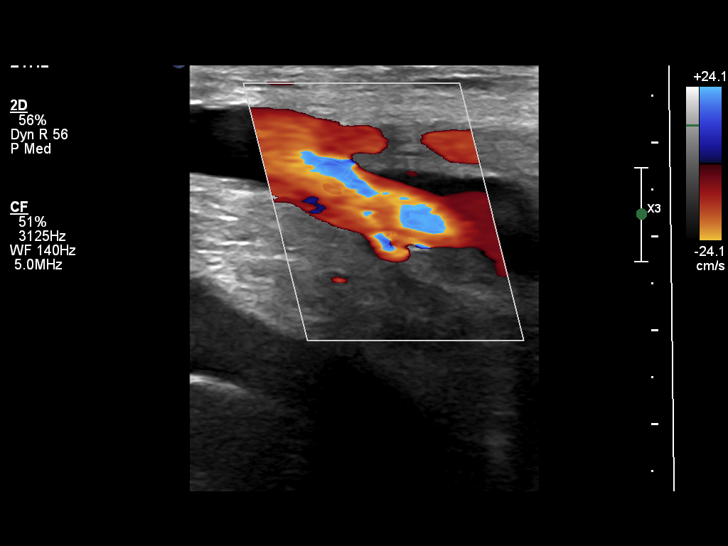
[im 11/63]
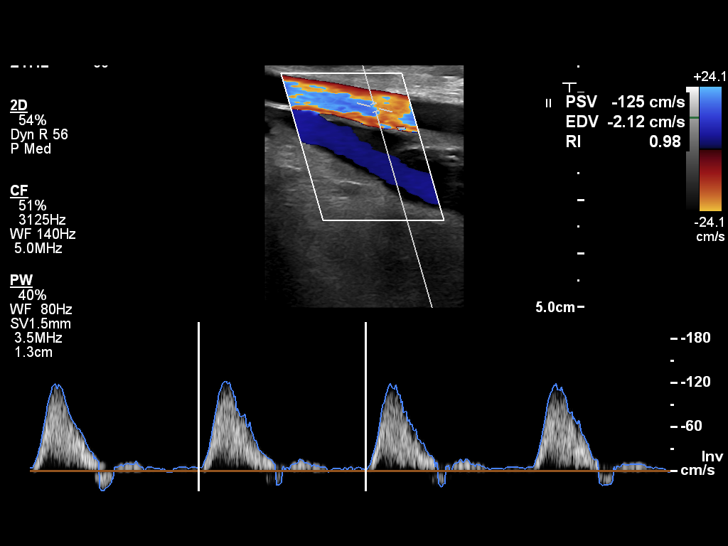
[im 16/63]
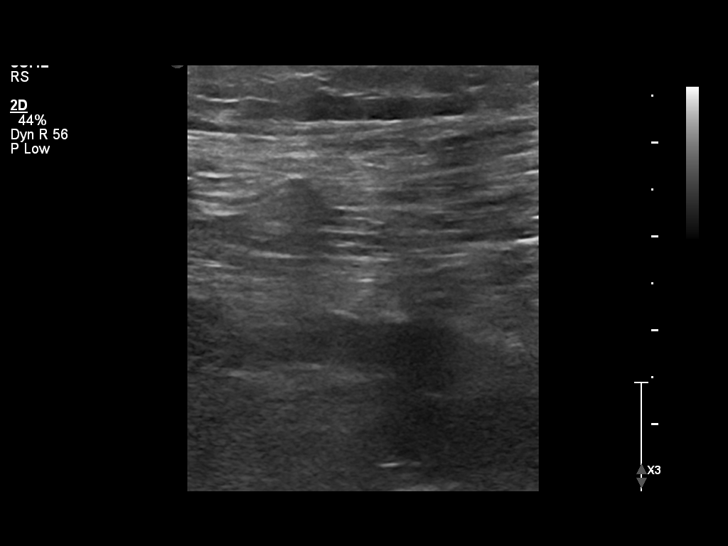
[im 21/63]
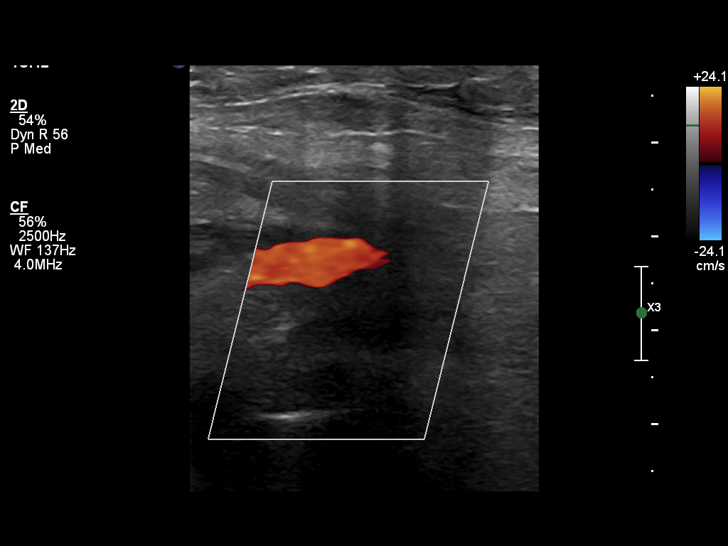
[im 24/63]
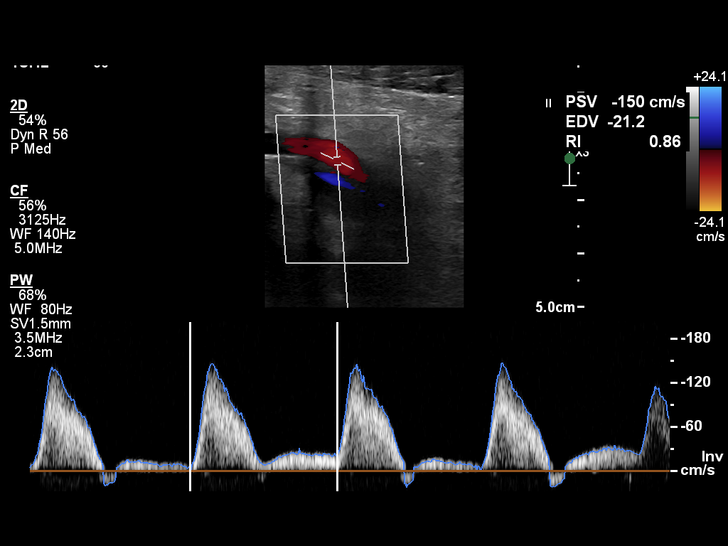
[im 29/63]
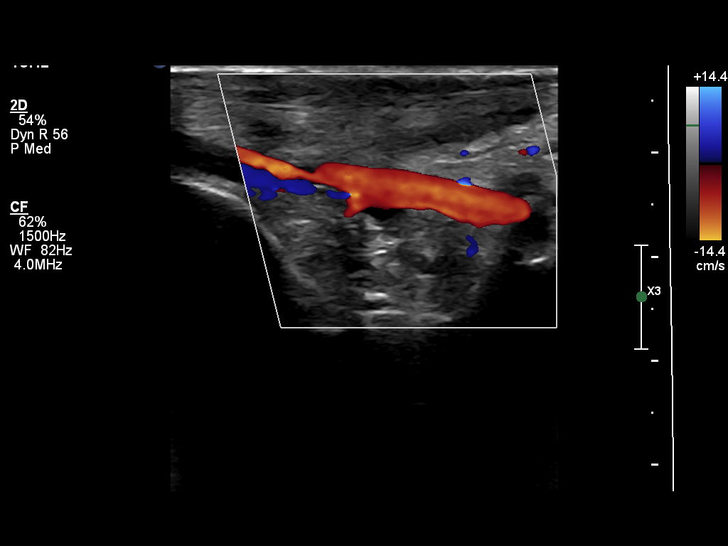
[im 34/63]
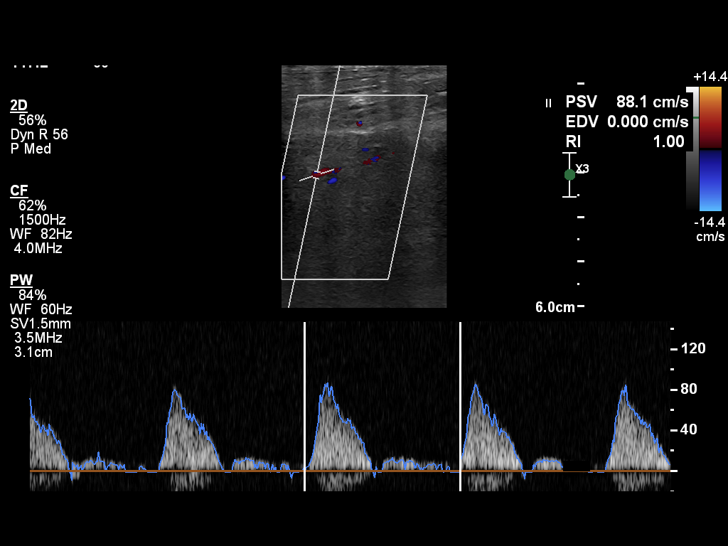
[im 39/63]
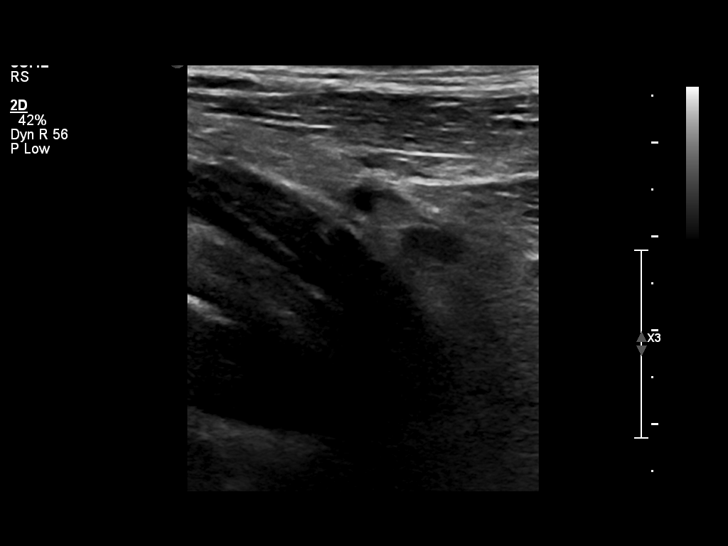
[im 42/63]
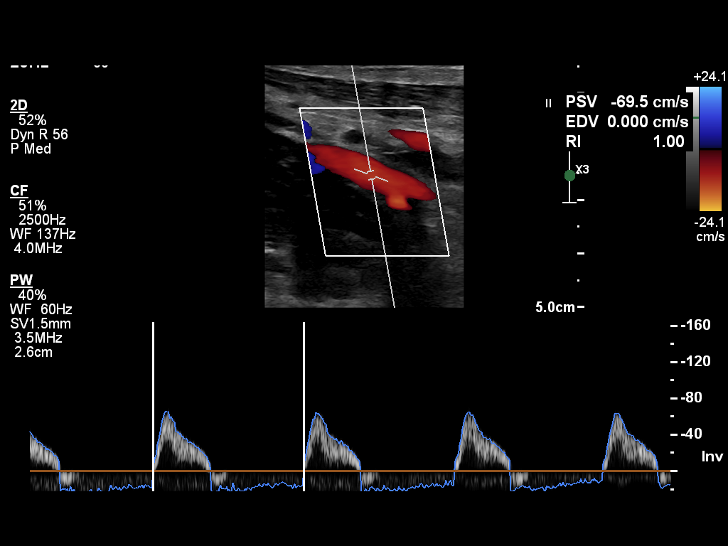
[im 47/63]
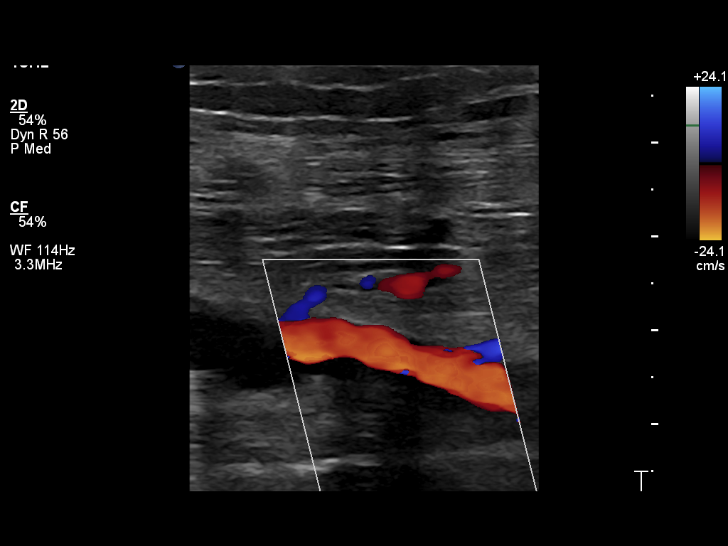
[im 52/63]
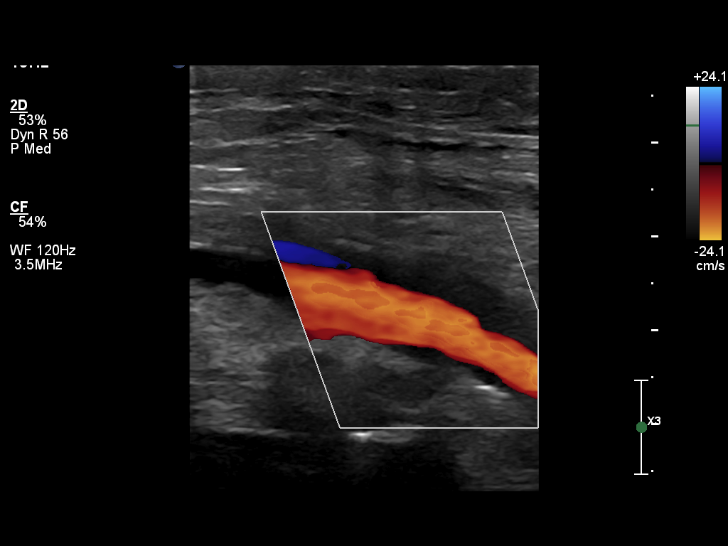
[im 57/63]
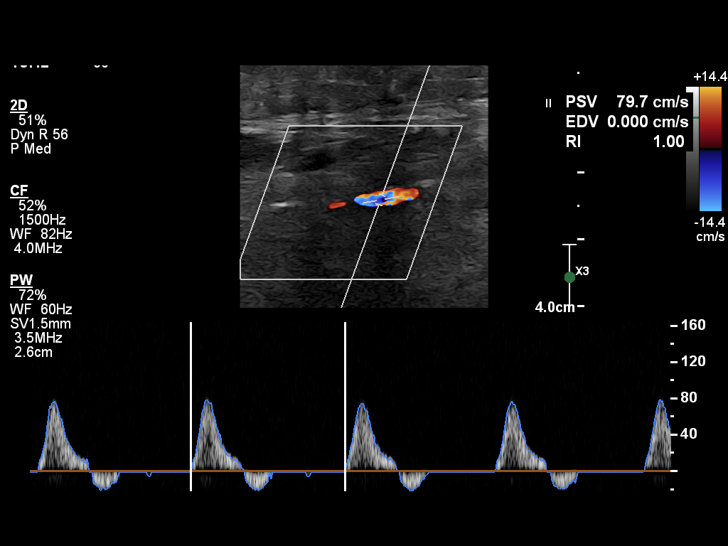
[im 63/63]
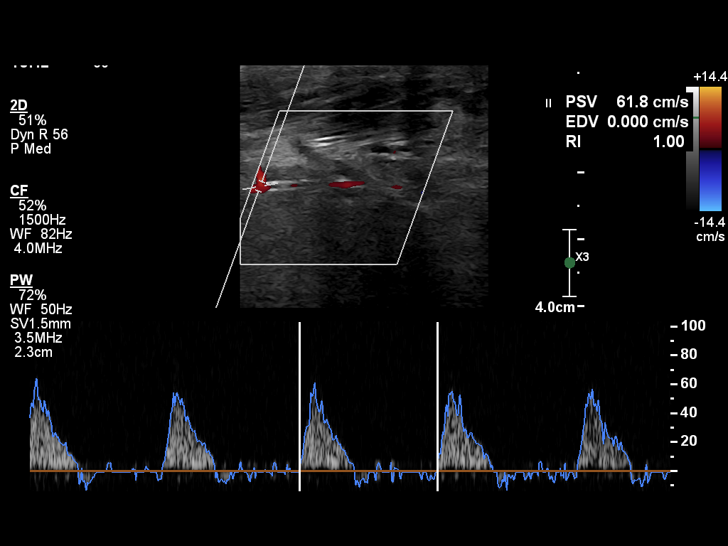

[14 of 25 positions shown; findings below may reference images not displayed]

EXAM

INDICATION

Peripheral arterial disease

TECHNIQUE

Targeted ultrasound of bilateral lower extremity arterial systems utilizing spectral analysis and
color with grayscale evaluation

COMPARISONS

None available at the time of dictation.

FINDINGS

RIGHT: Triphasic waveform except for distal posterior tibial artery.

Common femoral artery: 187 cm/s, triphasic

Profunda artery: 123 cm/s, triphasic

Superficial femoral artery proximal, mid, distal: 125, 119, 155 cm/s, triphasic

Popliteal artery proximal, distal: 93-10

Posterior tibial artery: 87-113, monophasic waveform

Dorsalis pedis artery: 86

LEFT: Diffuse biphasic waveform

Common femoral artery: 83

Profunda artery: 70

Superficial femoral artery proximal, mid, distal: 107, 109, 136

Popliteal artery proximal, distal: 73, 103

Posterior tibial artery: 82

Dorsalis pedis artery: 86

IMPRESSION
1. No area of complete occlusion or high-grade stenosis.

Tech Notes:

PAD.

## 2023-01-09 ENCOUNTER — Encounter: Admit: 2023-01-09 | Discharge: 2023-01-09 | Payer: MEDICARE

## 2023-01-09 NOTE — Telephone Encounter
-----  Message from [facility] sent at 01/09/2023  8:16 AM CST -----  Regarding: [facility] Pt Yellow Alert  Good Morning,    A Yellow Alert was reported by the device:    # "Ventricular Tachyarrhythmia" episodes (28) reported. EGMs suggestive of AFL/AF w/ RVR not true Ventricular Tachyarrhythmia. Device classified these events as NSVT. Morphology throughout episodes correlated w/ saved morphology template.  # Episode log shows 43 total episodes of various device classification recorded on 01/08/23, suggesting Pt was in and out of AFL/AF w/ RVR throughout 01/08/23. First episode recorded at 05:21 and last episode was recorded at 18:30. Longest episode was 1 minute 42 seconds. Avg V rate: 65-207pm. OAC not on current medications list.    Please review remote ICD note from 01/09/23 for further details.    Thank you,    Paden

## 2023-01-29 ENCOUNTER — Encounter: Admit: 2023-01-29 | Discharge: 2023-01-29 | Payer: MEDICARE

## 2023-01-29 NOTE — Telephone Encounter
Called and spoke to Pamela Hood's husband to check on symptoms.  Pamela Hood has advanced Alzheimer's and he is her primary caregiver.      He states that for the past 3 weeks, Pamela Hood has had swelling in her lower extremities.  They have been working with their PCP and are going in to have her lower legs wrapped and have been using what sounds like SCDs on her legs.  He states that they have not started her on any new medications.    He states that Pamela Hood has not had any new complaints.  He states that she is always short of breath, but does not feel like it has worsened.  She does fatigue easily, but not worse than usual.  He did ask her if she felt any palpitations of chest pain and she said no, she only felt short of breath. She is taking medications as prescribed.    Will route to [facility] for review and recommendations.

## 2023-01-29 NOTE — Telephone Encounter
-----   Message from Jodelle Gross sent at 01/29/2023  8:37 AM CST -----  Regarding: [facility] pt with multiple wide complex tachy arrithmia's  Good Morning,    Pt alerted today for multiple Wide Complex Tachy arrhythmia's and possible elevated HF;    Tachycardia: Wide Complex Tachycardia    Stored EGMs are consistent with or suggestive of Wide Complex Tachycardia  Total number of events: 10  Latest Episode: 01/29/2023  Longest Episode: 01/28/2023, 10:13; 16 sec.  Peak V-Rate: 69-165bpm    Heart Failure Diagnostic: Stable    Heart failure diagnostics assessed through the device  Status: Stable  Device has not classified this as elevated.  Cor Vue is below the refence line  Pt activity has decreased    If you need further information please see the 01/29/2023 report

## 2023-01-29 NOTE — Telephone Encounter
Hoos-Thompson, Karlton Lemon, MD  Baldwin Crown, RN  Caller: Unspecified (Today,  9:29 AM)  Her device check today is stable.  No medication changes advised.

## 2023-02-05 ENCOUNTER — Encounter: Admit: 2023-02-05 | Discharge: 2023-02-05 | Payer: MEDICARE

## 2023-02-06 ENCOUNTER — Encounter: Admit: 2023-02-06 | Discharge: 2023-02-06 | Payer: MEDICARE

## 2023-02-22 ENCOUNTER — Encounter: Admit: 2023-02-22 | Discharge: 2023-02-22 | Payer: MEDICARE

## 2023-02-24 ENCOUNTER — Encounter: Admit: 2023-02-24 | Discharge: 2023-02-24 | Payer: MEDICARE

## 2023-02-24 NOTE — Telephone Encounter
-----   Message from Verdis Frederickson, RN sent at 02/22/2023  1:58 PM CST -----  Regarding: Please reivew 02/20/23 remote indicating fluid overload, sudden accumulation  with pt/team.  TY

## 2023-02-24 NOTE — Telephone Encounter
Called and spoke with pt's spouse.  He statse that overall she is okay, but she does get short of breath with they get up and do things.  He also states that she tires easily.  He states that neither of those are new findings for her.  He states that she has had some right sided chest pain a few times over the past week or so, but also states she is scheduled to have right shoulder surgery later this month.  She is taking her medications as prescribed.  They do not check her weight regularly and he has not noticed any swelling.    Will route to [facility] for review and recommendations.

## 2023-05-07 ENCOUNTER — Encounter: Admit: 2023-05-07 | Discharge: 2023-05-07 | Payer: MEDICARE

## 2023-05-16 ENCOUNTER — Encounter: Admit: 2023-05-16 | Discharge: 2023-05-16 | Payer: MEDICARE

## 2023-05-18 ENCOUNTER — Encounter: Admit: 2023-05-18 | Discharge: 2023-05-18 | Payer: MEDICARE

## 2023-05-26 ENCOUNTER — Encounter: Admit: 2023-05-26 | Discharge: 2023-05-26 | Payer: MEDICARE

## 2023-06-12 ENCOUNTER — Encounter: Admit: 2023-06-12 | Discharge: 2023-06-12 | Payer: MEDICARE

## 2023-06-16 ENCOUNTER — Encounter: Admit: 2023-06-16 | Discharge: 2023-06-16 | Payer: MEDICARE

## 2023-06-20 ENCOUNTER — Encounter: Admit: 2023-06-20 | Discharge: 2023-06-20 | Payer: MEDICARE

## 2023-06-20 NOTE — Telephone Encounter
Hoos-Thompson, Majel Homer, MD  Rogelia Boga, RN  Caller: Unspecified (Today,  1:52 PM)  No changes at present.  Will continue to monitor.  thanks

## 2023-06-20 NOTE — Telephone Encounter
-----   Message from [place] A sent at 06/12/2023  2:31 PM CDT -----  Regarding: FW: SHT Possible fluid accumulation    ----- Message -----  From: Reino Kent, RN  Sent: 06/12/2023  10:28 AM CDT  To: Cvm Nurse Gen Card Team [place]  Subject: SHT Possible fluid accumulation                  Hello,    We received an alert on the pt's remote that showed the pt had an episode of NSVT and several episodes of AT that were short in duration.  Pt has had both previously.  Her remote however did show that she has had some possible fluid accumulation for ~14 days. Please follow up as needed.

## 2023-06-20 NOTE — Telephone Encounter
Called and spoke with Dorothye's husband.  She states that she is tired all the time, but he does not think this is new or worse for her.  He denies any weight gain, swelling or shortness of breath.  He states she has not complained of any palpitations or chest pain.      Will route Dr. Harvel Ricks for review and recommendations.

## 2023-06-27 ENCOUNTER — Encounter: Admit: 2023-06-27 | Discharge: 2023-06-27 | Payer: MEDICARE

## 2023-06-27 DIAGNOSIS — Z95810 Presence of automatic (implantable) cardiac defibrillator: Secondary | ICD-10-CM

## 2023-07-08 ENCOUNTER — Encounter: Admit: 2023-07-08 | Discharge: 2023-07-08 | Payer: MEDICARE

## 2023-08-01 ENCOUNTER — Encounter: Admit: 2023-08-01 | Discharge: 2023-08-01 | Payer: MEDICARE

## 2023-08-25 ENCOUNTER — Encounter: Admit: 2023-08-25 | Discharge: 2023-08-25 | Payer: MEDICARE

## 2023-09-05 ENCOUNTER — Encounter: Admit: 2023-09-05 | Discharge: 2023-09-05 | Payer: MEDICARE

## 2023-09-08 ENCOUNTER — Encounter: Admit: 2023-09-08 | Discharge: 2023-09-08 | Payer: MEDICARE

## 2023-09-08 NOTE — Telephone Encounter
Spoke with patient's husband and he reports she passed away yesterday. He found her in the morning.

## 2023-09-08 NOTE — Telephone Encounter
-----  Message from [place] [facility] sent at 09/08/2023  8:07 AM CDT -----  Regarding: FW: Alert for VF/Shock on 09-20-2023 (SHT)    ----- Message -----  From: Truddie Coco, RN  Sent: 09/08/2023   6:29 AM CDT  To: [facility] Nurse Gen Card Team [place]  Subject: Alert for VF/Shock on September 20, 2023 (SHT)                 Good morning-  alert received for VF with 1 shock delivered, appears to be true VF with unsuccessful shock occurring on September 20, 2023 @ 0144. Preceded by several hours of ST. Presenting rhythm @ 0200 on Sep 20, 2023 shows AP-VP 60 bpm, suspect of non-capture as it does not appear VF was terminated. It is too early for me to call pt but I would call asap this am to assess. See below for details.    Stored EGMs are consistent with or suggestive of Ventricular Fibrillation with unsuccessful therapy  Total episodes: 1 on Sep 20, 2023 @ 9144.  Number of ATP therapy: 1  Number of shocks delivered: 1  Episodes show VS 120 bpm with no atrial activity (no onset observed) lasting ~ 6 seconds followed by fine VF lasting for 16 seconds with AVG V rate 255 bpm. treated with 1 ATP (unsuccessful) followed by 1 30J shock, also unsuccessful.  Pt had ST with high rates preceding VT/VF episode    Please see full report in Epic for details and follow up as needed.    Thanks- Soil scientist / Device Team

## 2023-09-09 ENCOUNTER — Encounter: Admit: 2023-09-09 | Discharge: 2023-09-09 | Payer: MEDICARE

## 2023-09-16 ENCOUNTER — Encounter: Admit: 2023-09-16 | Discharge: 2023-09-16 | Payer: MEDICARE

## 2023-09-16 NOTE — Telephone Encounter
Received notification that  [employer] has not been connected since 09/08/23. Patient was instructed to look at his/her transmitter to make sure that it is plugged into power and send a manual transmission to reconnect the transmitter. If he/she has any questions about how to send a transmission or if the transmitter does not appear to be working properly, they need to contact the device company directly. Patient was provided with that contact number. Requested the patient send [name] a MyChart message or contact our device nurses at [phone] to let us know after they have sent their transmission. Placed call to preferred phone number, Got no answer. CDJ   Note: Patient needs to send a manual remote interrogation to reestablish communication to his/her remote transmitter.

## 2023-09-18 ENCOUNTER — Encounter: Admit: 2023-09-18 | Discharge: 2023-09-18 | Payer: MEDICARE

## 2023-09-23 DEATH — deceased
